# Patient Record
Sex: Female | Born: 1950 | Race: White | Marital: Married | Smoking: Never smoker
Health system: Southern US, Community
[De-identification: ages and names within clinical notes are randomized; demographics above are authoritative.]

## PROBLEM LIST (undated history)

## (undated) ENCOUNTER — Ambulatory Visit (INDEPENDENT_AMBULATORY_CARE_PROVIDER_SITE_OTHER): Admission: RE | Payer: Self-pay

## (undated) DIAGNOSIS — I10 Essential (primary) hypertension: Secondary | ICD-10-CM

## (undated) DIAGNOSIS — G902 Horner's syndrome: Secondary | ICD-10-CM

## (undated) DIAGNOSIS — M659 Synovitis and tenosynovitis, unspecified: Secondary | ICD-10-CM

## (undated) DIAGNOSIS — E785 Hyperlipidemia, unspecified: Secondary | ICD-10-CM

## (undated) DIAGNOSIS — F419 Anxiety disorder, unspecified: Secondary | ICD-10-CM

## (undated) HISTORY — DX: Essential (primary) hypertension: I10

## (undated) HISTORY — DX: Horner's syndrome: G90.2

## (undated) HISTORY — DX: Anxiety disorder, unspecified: F41.9

## (undated) HISTORY — DX: Synovitis and tenosynovitis, unspecified: M65.9

## (undated) HISTORY — DX: Hyperlipidemia, unspecified: E78.5

---

## 2000-02-19 ENCOUNTER — Emergency Department: Admit: 2000-02-19 | Payer: Self-pay | Source: Emergency Department | Admitting: Emergency Medicine

## 2007-01-26 ENCOUNTER — Ambulatory Visit
Admit: 2007-01-26 | Disposition: A | Discharge status: Home / Self Care | Payer: Self-pay | Source: Ambulatory Visit | Admitting: Orthopaedic Surgery

## 2009-03-20 ENCOUNTER — Emergency Department: Admit: 2009-03-20 | Payer: Self-pay | Source: Emergency Department | Admitting: Emergency Medical Services

## 2009-03-20 LAB — CK: Creatine Kinase (CK): 39 U/L (ref 20–140)

## 2009-03-20 LAB — CBC AND DIFFERENTIAL
Basophils Absolute: 0 /mm3 (ref 0.0–0.2)
Basophils: 0 % (ref 0–2)
Eosinophils Absolute: 0.1 /mm3 (ref 0.0–0.7)
Eosinophils: 2 % (ref 0–5)
Granulocytes Absolute: 4 /mm3 (ref 1.8–8.1)
Hematocrit: 37 % (ref 37.0–47.0)
Hgb: 12.6 G/DL (ref 12.0–16.0)
Immature Granulocytes Absolute: 0 CUMM (ref 0.0–0.0)
Immature Granulocytes: 0 % (ref 0–1)
Lymphocytes Absolute: 1.4 /mm3 (ref 0.5–4.4)
Lymphocytes: 25 % (ref 15–41)
MCH: 29.3 PG (ref 28.0–32.0)
MCHC: 34.1 G/DL (ref 32.0–36.0)
MCV: 86 FL (ref 80.0–100.0)
MPV: 10.6 FL (ref 9.4–12.3)
Monocytes Absolute: 0.4 /mm3 (ref 0.0–1.2)
Monocytes: 6 % (ref 0–11)
Neutrophils %: 67 % (ref 52–75)
Platelets: 269 /mm3 (ref 140–400)
RBC: 4.3 /mm3 (ref 4.20–5.40)
RDW: 12.6 % (ref 11.5–15.0)
WBC: 5.88 /mm3 (ref 3.50–10.80)

## 2009-03-20 LAB — BASIC METABOLIC PANEL
BUN: 9 MG/DL (ref 7–21)
CO2: 30 MEQ/L (ref 22–31)
Calcium: 9.8 MG/DL (ref 8.6–10.2)
Chloride: 101 MEQ/L (ref 98–107)
Creatinine: 0.7 MG/DL (ref 0.5–1.4)
Glucose: 96 MG/DL (ref 65–110)
Potassium: 4.1 MEQ/L (ref 3.6–5.0)
Sodium: 141 MEQ/L (ref 136–143)

## 2009-03-20 LAB — TROPONIN I QUANTITATIVE LEVEL - IFOH CERNER: Troponin I Quant: <0.01 NG/ML

## 2009-03-20 LAB — GFR

## 2009-04-22 ENCOUNTER — Ambulatory Visit
Admit: 2009-04-22 | Disposition: A | Discharge status: Home / Self Care | Payer: Self-pay | Source: Ambulatory Visit | Admitting: Family Medicine

## 2011-02-10 ENCOUNTER — Telehealth (INDEPENDENT_AMBULATORY_CARE_PROVIDER_SITE_OTHER): Payer: Self-pay

## 2011-02-10 NOTE — Telephone Encounter (Signed)
Message copied by Benedict Needy on Thu Feb 10, 2011  2:46 PM  ------       Message from: Sissy Hoff       Created: Thu Feb 10, 2011  2:26 PM       Regarding: Needs Rx refill       Contact: [phone]         Rx: Lexapro 5mg  call in at [facility] tel: [phone]

## 2011-02-10 NOTE — Telephone Encounter (Signed)
Lexapro 5 mg  # 30  With 1 refill faxed to [facility].

## 2011-02-25 ENCOUNTER — Other Ambulatory Visit (INDEPENDENT_AMBULATORY_CARE_PROVIDER_SITE_OTHER): Payer: Self-pay

## 2011-02-25 MED ORDER — MOMETASONE FUROATE 50 MCG/ACT NA SUSP
2.00 | Freq: Every day | NASAL | Status: DC
Start: 2011-02-25 — End: 2011-04-05

## 2011-02-25 MED ORDER — FAMOTIDINE 20 MG PO TABS
20.00 mg | ORAL_TABLET | Freq: Two times a day (BID) | ORAL | Status: DC
Start: 2011-02-25 — End: 2011-04-05

## 2011-03-14 ENCOUNTER — Telehealth (INDEPENDENT_AMBULATORY_CARE_PROVIDER_SITE_OTHER): Payer: Self-pay

## 2011-03-14 NOTE — Telephone Encounter (Signed)
PA phone # [phone] for nasonex is pending. Insurance co will fax back approval or denial.

## 2011-03-16 LAB — ECG-RIGHT SIDED
Atrial Rate: 104 {beats}/min
P Axis: 72 degrees
P-R Interval: 146 ms
Q-T Interval: 310 ms
QRS Duration: 72 ms
QTC Calculation (Bezet): 407 ms
R Axis: 38 degrees
T Axis: 63 degrees
Ventricular Rate: 104 {beats}/min

## 2011-04-05 ENCOUNTER — Encounter (INDEPENDENT_AMBULATORY_CARE_PROVIDER_SITE_OTHER): Payer: Self-pay | Admitting: Family Medicine

## 2011-04-05 ENCOUNTER — Ambulatory Visit (INDEPENDENT_AMBULATORY_CARE_PROVIDER_SITE_OTHER): Payer: PRIVATE HEALTH INSURANCE | Admitting: Family Medicine

## 2011-04-05 VITALS — BP 122/80 | HR 73 | Temp 96.8°F | Ht 60.0 in | Wt 119.6 lb

## 2011-04-05 DIAGNOSIS — M778 Other enthesopathies, not elsewhere classified: Secondary | ICD-10-CM

## 2011-04-05 DIAGNOSIS — F411 Generalized anxiety disorder: Secondary | ICD-10-CM

## 2011-04-05 DIAGNOSIS — J309 Allergic rhinitis, unspecified: Secondary | ICD-10-CM

## 2011-04-05 DIAGNOSIS — E559 Vitamin D deficiency, unspecified: Secondary | ICD-10-CM

## 2011-04-05 DIAGNOSIS — M65849 Other synovitis and tenosynovitis, unspecified hand: Secondary | ICD-10-CM

## 2011-04-05 DIAGNOSIS — Z0000 Encounter for general adult medical examination without abnormal findings: Secondary | ICD-10-CM

## 2011-04-05 DIAGNOSIS — M659 Synovitis and tenosynovitis, unspecified: Secondary | ICD-10-CM | POA: Insufficient documentation

## 2011-04-05 DIAGNOSIS — E785 Hyperlipidemia, unspecified: Secondary | ICD-10-CM

## 2011-04-05 DIAGNOSIS — F419 Anxiety disorder, unspecified: Secondary | ICD-10-CM

## 2011-04-05 DIAGNOSIS — Z9109 Other allergy status, other than to drugs and biological substances: Secondary | ICD-10-CM

## 2011-04-05 DIAGNOSIS — Z8719 Personal history of other diseases of the digestive system: Secondary | ICD-10-CM

## 2011-04-05 DIAGNOSIS — Z8669 Personal history of other diseases of the nervous system and sense organs: Secondary | ICD-10-CM

## 2011-04-05 MED ORDER — ESCITALOPRAM OXALATE 5 MG PO TABS
5.00 mg | ORAL_TABLET | Freq: Every day | ORAL | Status: DC
Start: 2011-04-05 — End: 2011-07-21

## 2011-04-05 MED ORDER — DICLOFENAC SODIUM 1 % TD GEL
Freq: Four times a day (QID) | TRANSDERMAL | Status: AC
Start: 2011-04-05 — End: ?

## 2011-04-05 MED ORDER — FAMOTIDINE 20 MG PO TABS
20.00 mg | ORAL_TABLET | Freq: Two times a day (BID) | ORAL | Status: DC
Start: 2011-04-05 — End: 2011-09-01

## 2011-04-05 MED ORDER — MOMETASONE FUROATE 50 MCG/ACT NA SUSP
2.00 | Freq: Every day | NASAL | Status: AC
Start: 2011-04-05 — End: 2012-04-04

## 2011-04-06 LAB — CBC AND DIFFERENTIAL
Baso(Absolute): 0.1 10*3/uL (ref 0.0–0.2)
Basos: 1 % (ref 0–3)
Eos: 2 % (ref 0–7)
Eosinophils Absolute: 0.1 10*3/uL (ref 0.0–0.4)
Hematocrit: 38.8 % (ref 34.0–46.6)
Hemoglobin: 12.4 g/dL (ref 11.1–15.9)
Immature Granulocytes Absolute: 0 10*3/uL (ref 0.0–0.1)
Immature Granulocytes: 0 % (ref 0–2)
Lymphocytes Absolute: 2.2 10*3/uL (ref 0.7–4.5)
Lymphocytes: 48 % — ABNORMAL HIGH (ref 14–46)
MCH: 29.5 pg (ref 26.6–33.0)
MCHC: 32 g/dL (ref 31.5–35.7)
MCV: 92 fL (ref 79–97)
Monocytes Absolute: 0.3 10*3/uL (ref 0.1–1.0)
Monocytes: 6 % (ref 4–13)
Neutrophils Absolute: 2 10*3/uL (ref 1.8–7.8)
Neutrophils: 43 % (ref 40–74)
Platelets: 252 10*3/uL (ref 140–415)
RBC: 4.2 x10E6/uL (ref 3.77–5.28)
RDW: 14 % (ref 12.3–15.4)
WBC: 4.7 10*3/uL (ref 4.0–10.5)

## 2011-04-06 LAB — LIPID PANEL
Cholesterol / HDL Ratio: 2.4 ratio units (ref 0.0–4.4)
Cholesterol: 213 mg/dL — ABNORMAL HIGH (ref 100–199)
HDL: 87 mg/dL (ref >39)
LDL Calculated: 114 mg/dL — ABNORMAL HIGH (ref 0–99)
Triglycerides: 61 mg/dL (ref 0–149)
VLDL Calculated: 12 mg/dL (ref 5–40)

## 2011-04-06 LAB — COMPREHENSIVE METABOLIC PANEL
ALT: 27 IU/L (ref 0–40)
AST (SGOT): 24 IU/L (ref 0–40)
Albumin/Globulin Ratio: 1.8 (ref 1.1–2.5)
Albumin: 4.6 g/dL (ref 3.5–5.5)
Alkaline Phosphatase: 91 IU/L (ref 25–150)
BUN / Creatinine Ratio: 23 (ref 9–23)
BUN: 16 mg/dL (ref 6–24)
Bilirubin, Total: 0.3 mg/dL (ref 0.0–1.2)
CO2: 24 mmol/L (ref 20–32)
Calcium: 9.5 mg/dL (ref 8.7–10.2)
Chloride: 101 mmol/L (ref 97–108)
Creatinine: 0.71 mg/dL (ref 0.57–1.00)
EGFR: 109 mL/min/{1.73_m2} (ref >59)
EGFR: 94 mL/min/{1.73_m2} (ref >59)
Globulin, Total: 2.5 g/dL (ref 1.5–4.5)
Glucose: 88 mg/dL (ref 65–99)
Potassium: 4.5 mmol/L (ref 3.5–5.2)
Protein, Total: 7.1 g/dL (ref 6.0–8.5)
Sodium: 139 mmol/L (ref 134–144)

## 2011-04-06 LAB — T4, FREE: T4, Free: 1.13 ng/dL (ref 0.82–1.77)

## 2011-04-06 LAB — TSH: TSH: 2.56 u[IU]/mL (ref 0.450–4.500)

## 2011-04-07 ENCOUNTER — Encounter (INDEPENDENT_AMBULATORY_CARE_PROVIDER_SITE_OTHER): Payer: Self-pay | Admitting: Family Medicine

## 2011-04-07 DIAGNOSIS — Z8669 Personal history of other diseases of the nervous system and sense organs: Secondary | ICD-10-CM | POA: Insufficient documentation

## 2011-04-07 DIAGNOSIS — F419 Anxiety disorder, unspecified: Secondary | ICD-10-CM | POA: Insufficient documentation

## 2011-04-07 NOTE — Progress Notes (Signed)
Subjective:       Patient ID: Kim Hendricks is a 60 y.o. female.    HPI  Feeling good. Still has same issues but less severe. 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 Saw neurology.  BP has been fine without meds-actually gets lightheaded if she gets up quickly-anxiety controlled, c/o sticking and some pain l thumb joint- no injury  Current outpatient prescriptions:escitalopram (LEXAPRO) 5 MG tablet, Take 1 tablet (5 mg total) by mouth daily., Disp: 90 tablet, Rfl: 2;  Diclofenac Sodium (VOLTAREN) 1 % GEL, Apply topically 4 (four) times daily., Disp: 1 Tube, Rfl: 2;  famotidine (PEPCID) 20 MG tablet, Take 1 tablet (20 mg total) by mouth 2 (two) times daily., Disp: 180 tablet, Rfl: 3  mometasone (NASONEX) 50 MCG/ACT nasal spray, 2 sprays by Nasal route daily., Disp: 17 g, Rfl: 2  Past Medical History   Diagnosis Date   . Anxiety    . Hypertension    . Hyperlipidemia    . Horner's syndrome      R eye pyosis-opth   . Flexor Tenosynovitis Of Thumb        The following portions of the patient's history were reviewed and updated as appropriate: allergies, current medications, past family history, past medical history, past social history, past surgical history and problem list.    Review of Systems   Constitutional: Positive for fatigue.   HENT: Negative.    Respiratory: Negative.    Cardiovascular: Negative.    Gastrointestinal: Negative.    Musculoskeletal: Positive for joint swelling.   Skin: Negative.    Neurological: Negative.    Psychiatric/Behavioral: The patient is nervous/anxious.          BP 122/80  Pulse 73  Temp(Src) 96.8 F (36 C) (Temporal Artery)  Ht 1.524 m (5')  Wt 54.25 kg (119 lb 9.6 oz)  BMI 23.36 kg/m2  SpO2 99%    Objective:    Physical Exam   Constitutional: She is oriented to person, place, and time. She appears well-developed.   HENT:   Head: Normocephalic and atraumatic.   Right Ear: External ear normal.   Left Ear: External ear normal.   Nose:  Nose normal.   Mouth/Throat: Oropharynx is clear and moist. No oropharyngeal exudate.   Eyes: Conjunctivae and EOM are normal. Pupils are equal, round, and reactive to light. Right eye exhibits no discharge. Left eye exhibits no discharge. No scleral icterus.        Mild R eye ptosis   Neck: Normal range of motion. Neck supple. No JVD present. No thyromegaly present.   Cardiovascular: Normal rate, regular rhythm, normal heart sounds and intact distal pulses.  Exam reveals no gallop and no friction rub.    No murmur heard.  Pulmonary/Chest: Effort normal and breath sounds normal. No respiratory distress. She has no wheezes. She has no rales. She exhibits no tenderness.   Abdominal: Soft. Bowel sounds are normal. She exhibits no distension and no mass. There is no tenderness. There is no rebound and no guarding.   Musculoskeletal: Normal range of motion. She exhibits no edema and no tenderness.        L thumb with mild tenderness at the base some mild sticking  With flexion   Lymphadenopathy:     She has no cervical adenopathy.   Neurological: She is alert and oriented to person, place, and time. She has normal reflexes. She displays normal reflexes. No cranial nerve deficit. She exhibits normal  muscle tone. Coordination normal.   Skin: Skin is warm and dry. No rash noted. No erythema. No pallor.   Psychiatric: She has a normal mood and affect. Her behavior is normal. Judgment and thought content normal.           Assessment:     well exam-improved      see orthopedics , wear thumb spicca splint as needed for thumb tendonitis  Plan:       Check labs, continue meds    xray l thumb

## 2011-04-09 LAB — VITAMIN D-25 HYDROXY (D2/D3/TOTAL)
25-Hydroxy D Total: 44 ng/mL
25-Hydroxy D2: 13 ng/mL
25-Hydroxy D3: 31 ng/mL

## 2011-04-21 ENCOUNTER — Telehealth (INDEPENDENT_AMBULATORY_CARE_PROVIDER_SITE_OTHER): Payer: Self-pay

## 2011-04-21 NOTE — Telephone Encounter (Signed)
Lm on home phone for pt to cb to notify of results.

## 2011-04-26 ENCOUNTER — Telehealth (INDEPENDENT_AMBULATORY_CARE_PROVIDER_SITE_OTHER): Payer: Self-pay

## 2011-04-26 NOTE — Telephone Encounter (Signed)
Patient is informed about her labs are all ok but repeat CBC 3 weeks -may have virus-not quite nornal-not fasting. Thanks

## 2011-04-28 ENCOUNTER — Ambulatory Visit (INDEPENDENT_AMBULATORY_CARE_PROVIDER_SITE_OTHER): Payer: PRIVATE HEALTH INSURANCE

## 2011-04-28 DIAGNOSIS — R7989 Other specified abnormal findings of blood chemistry: Secondary | ICD-10-CM

## 2011-04-28 NOTE — Progress Notes (Signed)
Pt in for blood work which was done by our [facility] lab tech

## 2011-04-29 ENCOUNTER — Telehealth (INDEPENDENT_AMBULATORY_CARE_PROVIDER_SITE_OTHER): Payer: Self-pay

## 2011-04-29 ENCOUNTER — Encounter (INDEPENDENT_AMBULATORY_CARE_PROVIDER_SITE_OTHER): Payer: Self-pay

## 2011-04-29 LAB — CBC AND DIFFERENTIAL
Baso(Absolute): 0 10*3/uL (ref 0.0–0.2)
Basos: 0 % (ref 0–3)
Eos: 2 % (ref 0–7)
Eosinophils Absolute: 0.1 10*3/uL (ref 0.0–0.4)
Hematocrit: 37 % (ref 34.0–46.6)
Hemoglobin: 12 g/dL (ref 11.1–15.9)
Immature Granulocytes Absolute: 0 10*3/uL (ref 0.0–0.1)
Immature Granulocytes: 0 % (ref 0–2)
Lymphocytes Absolute: 2.3 10*3/uL (ref 0.7–4.5)
Lymphocytes: 34 % (ref 14–46)
MCH: 30.2 pg (ref 26.6–33.0)
MCHC: 32.4 g/dL (ref 31.5–35.7)
MCV: 93 fL (ref 79–97)
Monocytes Absolute: 0.4 10*3/uL (ref 0.1–1.0)
Monocytes: 6 % (ref 4–13)
Neutrophils Absolute: 3.9 10*3/uL (ref 1.8–7.8)
Neutrophils: 58 % (ref 40–74)
Platelets: 241 10*3/uL (ref 140–415)
RBC: 3.98 x10E6/uL (ref 3.77–5.28)
RDW: 13.7 % (ref 12.3–15.4)
WBC: 6.7 10*3/uL (ref 4.0–10.5)

## 2011-04-29 NOTE — Telephone Encounter (Signed)
Pt notified of results:  1. CBC is back to normal  2. Thyroid u/s shows small cyst- not suspicious   3. Mammogram is ok- advised pt to f/u if any breast symptoms

## 2011-05-31 ENCOUNTER — Ambulatory Visit (INDEPENDENT_AMBULATORY_CARE_PROVIDER_SITE_OTHER): Payer: PRIVATE HEALTH INSURANCE | Admitting: Family Medicine

## 2011-05-31 ENCOUNTER — Encounter (INDEPENDENT_AMBULATORY_CARE_PROVIDER_SITE_OTHER): Payer: Self-pay | Admitting: Family Medicine

## 2011-05-31 VITALS — BP 110/78 | HR 73 | Temp 97.2°F | Ht 60.0 in | Wt 120.0 lb

## 2011-05-31 DIAGNOSIS — M719 Bursopathy, unspecified: Secondary | ICD-10-CM

## 2011-05-31 DIAGNOSIS — M25519 Pain in unspecified shoulder: Secondary | ICD-10-CM

## 2011-05-31 DIAGNOSIS — M7590 Shoulder lesion, unspecified, unspecified shoulder: Secondary | ICD-10-CM

## 2011-05-31 DIAGNOSIS — T50905A Adverse effect of unspecified drugs, medicaments and biological substances, initial encounter: Secondary | ICD-10-CM

## 2011-05-31 DIAGNOSIS — T887XXA Unspecified adverse effect of drug or medicament, initial encounter: Secondary | ICD-10-CM

## 2011-06-02 ENCOUNTER — Encounter (INDEPENDENT_AMBULATORY_CARE_PROVIDER_SITE_OTHER): Payer: Self-pay | Admitting: Family Medicine

## 2011-06-02 DIAGNOSIS — T50905A Adverse effect of unspecified drugs, medicaments and biological substances, initial encounter: Secondary | ICD-10-CM | POA: Insufficient documentation

## 2011-06-02 NOTE — Progress Notes (Signed)
Subjective:       Patient ID: Kim Hendricks is a 60 y.o. female.    HPI  60 yo wf here for follow up. C/O L arm pain-occ pain with movement especially abduction of arm . No injury, nothing happened. Been going on for several months  Past Medical History   Diagnosis Date   . Anxiety    . Hypertension    . Hyperlipidemia    . Horner's syndrome      R eye pyosis-opth   . Flexor Tenosynovitis Of Thumb    . Other complications of the administration of anesthesia or other sedation in labor and delivery, antepartum      autonomic dysfunction-resolved-6 months to resolve     Current outpatient prescriptions:Diclofenac Sodium (VOLTAREN) 1 % GEL, Apply topically 4 (four) times daily., Disp: 1 Tube, Rfl: 2;  escitalopram (LEXAPRO) 5 MG tablet, Take 1 tablet (5 mg total) by mouth daily., Disp: 90 tablet, Rfl: 2;  famotidine (PEPCID) 20 MG tablet, Take 1 tablet (20 mg total) by mouth 2 (two) times daily., Disp: 180 tablet, Rfl: 3  mometasone (NASONEX) 50 MCG/ACT nasal spray, 2 sprays by Nasal route daily., Disp: 17 g, Rfl: 2    The following portions of the patient's history were reviewed and updated as appropriate: allergies, current medications, past family history, past medical history, past social history, past surgical history and problem list.    Review of Systems   Constitutional: Negative.    HENT: Negative.    Respiratory: Negative.    Cardiovascular: Negative.    Gastrointestinal: Negative.    Musculoskeletal: Positive for arthralgias.   Skin: Negative.    Neurological: Negative.    Psychiatric/Behavioral: Negative.          BP 110/78  Pulse 73  Temp(Src) 97.2 F (36.2 C) (Temporal Artery)  Ht 1.524 m (5')  Wt 54.432 kg (120 lb)  BMI 23.44 kg/m2  SpO2 97%    Objective:    Physical Exam   Constitutional: She is oriented to person, place, and time. She appears well-developed and well-nourished. No distress.   HENT:   Head: Normocephalic and atraumatic.   Mouth/Throat: No oropharyngeal exudate.   Eyes:  Conjunctivae and EOM are normal. Pupils are equal, round, and reactive to light. Right eye exhibits no discharge. Left eye exhibits no discharge. No scleral icterus.   Neck: Normal range of motion. Neck supple. No JVD present. No thyromegaly present.   Cardiovascular: Normal rate and regular rhythm.    Pulmonary/Chest: Effort normal and breath sounds normal.   Musculoskeletal: She exhibits tenderness. She exhibits no edema.        Mild tenderness L supraspinatus tendon, no swselling   Lymphadenopathy:     She has no cervical adenopathy.   Neurological: She is alert and oriented to person, place, and time.   Skin: Skin is warm and dry. She is not diaphoretic.   Psychiatric: She has a normal mood and affect. Her behavior is normal. Judgment and thought content normal.           Assessment:     L shoulder pain/tendonitis   anxiety d/o      Plan:       xray. PT consult

## 2011-06-07 ENCOUNTER — Telehealth (INDEPENDENT_AMBULATORY_CARE_PROVIDER_SITE_OTHER): Payer: Self-pay

## 2011-06-07 NOTE — Telephone Encounter (Addendum)
Per PPN, lvm informing normal xray advised can do PT.

## 2011-06-08 ENCOUNTER — Encounter (INDEPENDENT_AMBULATORY_CARE_PROVIDER_SITE_OTHER): Payer: Self-pay

## 2011-07-21 ENCOUNTER — Encounter (INDEPENDENT_AMBULATORY_CARE_PROVIDER_SITE_OTHER): Payer: Self-pay | Admitting: Family Medicine

## 2011-07-21 ENCOUNTER — Other Ambulatory Visit (INDEPENDENT_AMBULATORY_CARE_PROVIDER_SITE_OTHER): Payer: Self-pay | Admitting: Family Medicine

## 2011-07-21 ENCOUNTER — Ambulatory Visit (INDEPENDENT_AMBULATORY_CARE_PROVIDER_SITE_OTHER): Payer: PRIVATE HEALTH INSURANCE | Admitting: Family Medicine

## 2011-07-21 VITALS — BP 100/66 | HR 75 | Temp 97.9°F | Resp 17 | Ht 60.0 in | Wt 122.0 lb

## 2011-07-21 DIAGNOSIS — K219 Gastro-esophageal reflux disease without esophagitis: Secondary | ICD-10-CM

## 2011-07-21 DIAGNOSIS — F411 Generalized anxiety disorder: Secondary | ICD-10-CM

## 2011-07-21 DIAGNOSIS — F419 Anxiety disorder, unspecified: Secondary | ICD-10-CM

## 2011-07-21 MED ORDER — ESCITALOPRAM OXALATE 5 MG PO TABS
ORAL_TABLET | ORAL | Status: DC
Start: 2011-07-21 — End: 2011-08-31

## 2011-07-21 MED ORDER — LORAZEPAM 0.5 MG PO TABS
0.50 mg | ORAL_TABLET | Freq: Four times a day (QID) | ORAL | Status: AC | PRN
Start: 2011-07-21 — End: 2011-07-31

## 2011-07-21 MED ORDER — ESOMEPRAZOLE MAGNESIUM 40 MG PO CPDR
40.00 mg | DELAYED_RELEASE_CAPSULE | Freq: Every day | ORAL | Status: DC
Start: 2011-07-21 — End: 2011-09-01

## 2011-07-21 NOTE — Progress Notes (Signed)
Subjective:       Patient ID: Kim Hendricks is a 61 y.o. female.    XB:[id number]    HPI Patient complains of epigastric pain for the past few weeks, she's tried Pepcid which did not work, but prevacid helped more, but the pain is still present, worse at night. She's had stomach problems for most of her life. She would also like to get on the brand name Lexapro, she does not feel the generic is working.    The following portions of the patient's history were reviewed and updated as appropriate: allergies, current medications, past family history, past medical history, past social history, past surgical history and problem list.    Current Outpatient Prescriptions on File Prior to Visit   Medication Sig Dispense Refill   . Diclofenac Sodium (VOLTAREN) 1 % GEL Apply topically 4 (four) times daily.  1 Tube  2   . famotidine (PEPCID) 20 MG tablet Take 1 tablet (20 mg total) by mouth 2 (two) times daily.  180 tablet  3   . mometasone (NASONEX) 50 MCG/ACT nasal spray 2 sprays by Nasal route daily.  17 g  2   . DISCONTD: escitalopram (LEXAPRO) 5 MG tablet Take 1 tablet (5 mg total) by mouth daily.  90 tablet  2      No Known Allergies     Review of Systems   Constitutional: Negative.    HENT: Negative.    Respiratory: Negative.    Cardiovascular: Negative.    Gastrointestinal: Positive for abdominal pain and abdominal distention. Negative for nausea and vomiting.           Filed Vitals:    07/21/11 1108   BP: 100/66   Pulse: 75   Temp: 97.9 F (36.6 C)   TempSrc: Tympanic   Resp: 17   Height: 1.524 m (5')   Weight: 55.339 kg (122 lb)   SpO2: 97%       Objective:    Physical Exam   Constitutional: She appears well-developed and well-nourished.   HENT:   Right Ear: External ear normal.   Left Ear: External ear normal.   Nose: Nose normal.   Mouth/Throat: Oropharynx is clear and moist.   Eyes: Conjunctivae and EOM are normal. Pupils are equal, round, and reactive to light.   Neck: Neck supple.   Cardiovascular: Normal rate and  regular rhythm.    Pulmonary/Chest: Effort normal and breath sounds normal.   Abdominal: Soft. Bowel sounds are normal. She exhibits no distension and no mass. There is no tenderness. There is no rebound and no guarding.           Assessment:       GERD  anxiety      Plan:       Labs-H.Pylori breath test  Nexium  Refill Lexapro with Brand name

## 2011-07-22 LAB — H. PYLORI BREATH TEST: H. pylori Breath Test: NEGATIVE

## 2011-07-27 ENCOUNTER — Encounter (INDEPENDENT_AMBULATORY_CARE_PROVIDER_SITE_OTHER): Payer: Self-pay | Admitting: Family Medicine

## 2011-08-04 ENCOUNTER — Telehealth (INDEPENDENT_AMBULATORY_CARE_PROVIDER_SITE_OTHER): Payer: Self-pay

## 2011-08-04 NOTE — Telephone Encounter (Signed)
Spoke with pt regarding rx coverage- attempted to call [employer] for prior authorization of Nexium, unable to identify pt.  Pt verified member id- however when calling [employer] you must leave off the first 3 digits of id number in order for them to pull up pt in the system.

## 2011-08-24 ENCOUNTER — Ambulatory Visit (INDEPENDENT_AMBULATORY_CARE_PROVIDER_SITE_OTHER): Payer: PRIVATE HEALTH INSURANCE | Admitting: Family Medicine

## 2011-08-24 ENCOUNTER — Encounter (INDEPENDENT_AMBULATORY_CARE_PROVIDER_SITE_OTHER): Payer: Self-pay | Admitting: Family Medicine

## 2011-08-24 VITALS — BP 130/88 | HR 81 | Temp 98.0°F | Resp 18 | Ht <= 58 in | Wt 121.8 lb

## 2011-08-24 DIAGNOSIS — K219 Gastro-esophageal reflux disease without esophagitis: Secondary | ICD-10-CM

## 2011-08-24 MED ORDER — LANSOPRAZOLE 30 MG PO CPDR
30.00 mg | DELAYED_RELEASE_CAPSULE | Freq: Every day | ORAL | Status: AC
Start: 2011-08-24 — End: 2012-08-23

## 2011-08-24 NOTE — Progress Notes (Signed)
Subjective:       Patient ID: Kim Hendricks is a 61 y.o. female.    ZO:XWRUEAV pain    HPI Patient complains of epigastric pain, was worse last night, she vomited in the middle of the night. She was taking Maalox, Mylanta and Pepcid. She's waiting on a prior auth for Nexium    The following portions of the patient's history were reviewed and updated as appropriate: allergies, current medications, past family history, past medical history, past social history, past surgical history and problem list.    Past Medical History   Diagnosis Date   . Anxiety    . Hypertension    . Hyperlipidemia    . Horner's syndrome      R eye pyosis-opth   . Flexor Tenosynovitis Of Thumb    . Other complications of the administration of anesthesia or other sedation in labor and delivery, antepartum      autonomic dysfunction-resolved-6 months to resolve      Current Outpatient Prescriptions on File Prior to Visit   Medication Sig Dispense Refill   . Diclofenac Sodium (VOLTAREN) 1 % GEL Apply topically 4 (four) times daily.  1 Tube  2   . escitalopram (LEXAPRO) 5 MG tablet One po qd (Brand name medically necessary)  30 tablet  5   . famotidine (PEPCID) 20 MG tablet Take 1 tablet (20 mg total) by mouth 2 (two) times daily.  180 tablet  3   . mometasone (NASONEX) 50 MCG/ACT nasal spray 2 sprays by Nasal route daily.  17 g  2   . esomeprazole (NEXIUM) 40 MG capsule Take 1 capsule (40 mg total) by mouth daily.  30 capsule  1        No Known Allergies     Review of Systems   Constitutional: Negative.    HENT: Negative.    Respiratory: Negative.    Cardiovascular: Negative.    Gastrointestinal: Positive for vomiting and abdominal pain. Negative for nausea, diarrhea, constipation and abdominal distention.           Filed Vitals:    08/24/11 1039   BP: 130/88   Pulse: 81   Temp: 98 F (36.7 C)   TempSrc: Tympanic   Resp: 18   Height: 1.473 m (4\' 10" )   Weight: 55.248 kg (121 lb 12.8 oz)   SpO2: 98%       Objective:    Physical Exam    Constitutional: She appears well-developed and well-nourished.   HENT:   Right Ear: External ear normal.   Left Ear: External ear normal.   Nose: Nose normal.   Mouth/Throat: Oropharynx is clear and moist.   Eyes: Conjunctivae and EOM are normal. Pupils are equal, round, and reactive to light.   Neck: Neck supple.   Cardiovascular: Normal rate and regular rhythm.  Exam reveals no gallop and no friction rub.    No murmur heard.  Pulmonary/Chest: Effort normal and breath sounds normal. She has no wheezes. She has no rales.   Abdominal: Soft. Bowel sounds are normal. She exhibits no distension. There is tenderness. There is no rebound and no guarding.           Assessment:       Gerd      Plan:       Patient will take Prevacid 30mg  until prior auth comes through.  Followup if symptoms don't improve in 2-3days or worsen

## 2011-08-30 ENCOUNTER — Telehealth (INDEPENDENT_AMBULATORY_CARE_PROVIDER_SITE_OTHER): Payer: Self-pay

## 2011-08-30 NOTE — Telephone Encounter (Signed)
Patient has an appt with Dr.Nisha Chand (GI) today at 2:30pm

## 2011-08-30 NOTE — Telephone Encounter (Signed)
She has started taking the nexium. It doesn't make her feel to good when she takes it. It makes it worse. She is having trouble swallowing. She would like to know what she can do.

## 2011-08-31 ENCOUNTER — Other Ambulatory Visit (INDEPENDENT_AMBULATORY_CARE_PROVIDER_SITE_OTHER): Payer: Self-pay | Admitting: Family Medicine

## 2011-08-31 ENCOUNTER — Telehealth (INDEPENDENT_AMBULATORY_CARE_PROVIDER_SITE_OTHER): Payer: Self-pay | Admitting: Family Medicine

## 2011-08-31 MED ORDER — PAROXETINE HCL ER 12.5 MG PO TB24
12.50 mg | ORAL_TABLET | Freq: Every day | ORAL | Status: AC
Start: 2011-08-31 — End: 2012-08-30

## 2011-08-31 NOTE — Telephone Encounter (Signed)
Spoke with patient, will wean off Lexapro, will do PaxilCR 12.5mg , sent to [facility]  She went to GI Dr.Chand, she was put on Prevacid twice a day .

## 2011-08-31 NOTE — Telephone Encounter (Signed)
Message copied by Lerry Liner on Wed Aug 31, 2011  9:59 AM  ------       Message from: Duffy Rhody       Created: Wed Aug 31, 2011  8:35 AM       Contact: [phone]         Patient is having reaction to prevacid and needs to discuss med change.

## 2011-09-01 ENCOUNTER — Ambulatory Visit (INDEPENDENT_AMBULATORY_CARE_PROVIDER_SITE_OTHER): Payer: PRIVATE HEALTH INSURANCE | Admitting: Family Medicine

## 2011-09-01 ENCOUNTER — Encounter (INDEPENDENT_AMBULATORY_CARE_PROVIDER_SITE_OTHER): Payer: Self-pay | Admitting: Family Medicine

## 2011-09-01 VITALS — BP 140/90 | HR 89 | Temp 98.5°F | Resp 18 | Ht <= 58 in | Wt 121.0 lb

## 2011-09-01 DIAGNOSIS — K219 Gastro-esophageal reflux disease without esophagitis: Secondary | ICD-10-CM

## 2011-09-01 DIAGNOSIS — G909 Disorder of the autonomic nervous system, unspecified: Secondary | ICD-10-CM

## 2011-09-01 DIAGNOSIS — G901 Familial dysautonomia [Riley-Day]: Secondary | ICD-10-CM

## 2011-09-01 MED ORDER — CLONAZEPAM 1 MG PO TABS
1.00 mg | ORAL_TABLET | Freq: Two times a day (BID) | ORAL | Status: AC | PRN
Start: 2011-09-01 — End: 2011-10-01

## 2011-09-01 NOTE — Progress Notes (Signed)
Subjective:       Patient ID: Kim Hendricks is a 61 y.o. female.    ZO:XWRUEAVWU and reflux    HPI Patient here to followup, she saw the GI who initially doubled her Prevacid, but the patient thought she was having a drug interaction, so she was switched to Aciphex. She's having dysautonomia attacks, but not as severe as in 2010. She started taking the PaxilCR and would like Klonopin to help with her symptoms until the PaxilCR kicks in.    The following portions of the patient's history were reviewed and updated as appropriate: allergies, current medications, past family history, past medical history, past social history, past surgical history and problem list.    .  Past Medical History   Diagnosis Date   . Anxiety    . Hypertension    . Hyperlipidemia    . Horner's syndrome      R eye pyosis-opth   . Flexor Tenosynovitis Of Thumb    . Other complications of the administration of anesthesia or other sedation in labor and delivery, antepartum      autonomic dysfunction-resolved-6 months to resolve      Current Outpatient Prescriptions on File Prior to Visit   Medication Sig Dispense Refill   . Diclofenac Sodium (VOLTAREN) 1 % GEL Apply topically 4 (four) times daily.  1 Tube  2   . lansoprazole (PREVACID) 30 MG capsule Take 1 capsule (30 mg total) by mouth daily.  30 capsule  12   . mometasone (NASONEX) 50 MCG/ACT nasal spray 2 sprays by Nasal route daily.  17 g  2   . PARoxetine (PAXIL CR) 12.5 MG 24 hr tablet Take 1 tablet (12.5 mg total) by mouth daily.  30 tablet  2   . DISCONTD: esomeprazole (NEXIUM) 40 MG capsule Take 1 capsule (40 mg total) by mouth daily.  30 capsule  1   . DISCONTD: famotidine (PEPCID) 20 MG tablet Take 1 tablet (20 mg total) by mouth 2 (two) times daily.  180 tablet  3        No Known Allergies     Review of Systems   Constitutional: Negative.    HENT: Negative.    Respiratory: Negative.    Cardiovascular: Negative.    Gastrointestinal: Positive for abdominal pain.   Neurological:  Positive for dizziness.   Psychiatric/Behavioral: The patient is nervous/anxious.            Filed Vitals:    09/01/11 1300   BP: 140/90   Pulse: 89   Temp: 98.5 F (36.9 C)   TempSrc: Tympanic   Resp: 18   Height: 1.467 m (4' 9.75")   Weight: 54.885 kg (121 lb)   SpO2: 98%       Objective:    Physical Exam   Constitutional: She is oriented to person, place, and time. She appears well-developed and well-nourished.   HENT:   Right Ear: External ear normal.   Left Ear: External ear normal.   Nose: Nose normal.   Mouth/Throat: Oropharynx is clear and moist.   Eyes: Conjunctivae and EOM are normal. Pupils are equal, round, and reactive to light.   Neck: Neck supple.   Cardiovascular: Normal rate and regular rhythm.  Exam reveals no gallop.    No murmur heard.  Pulmonary/Chest: Effort normal and breath sounds normal. She has no wheezes. She has no rales.   Neurological: She is alert and oriented to person, place, and time. She has normal reflexes. She displays  normal reflexes.           Assessment:       Dysautonomia  GERD      Plan:       Will give patient Klonopin to help until Paxil CR becomes effective  Continue Aciphex, GI will do endoscopy when she returns from her cruise  Followup in one month

## 2011-09-02 ENCOUNTER — Encounter (INDEPENDENT_AMBULATORY_CARE_PROVIDER_SITE_OTHER): Payer: Self-pay

## 2011-09-06 ENCOUNTER — Encounter (INDEPENDENT_AMBULATORY_CARE_PROVIDER_SITE_OTHER): Payer: Self-pay | Admitting: Family Medicine

## 2011-09-06 ENCOUNTER — Ambulatory Visit (INDEPENDENT_AMBULATORY_CARE_PROVIDER_SITE_OTHER): Payer: PRIVATE HEALTH INSURANCE | Admitting: Family Medicine

## 2011-09-06 VITALS — BP 140/84 | HR 82 | Temp 97.2°F | Resp 18 | Ht 60.0 in | Wt 121.0 lb

## 2011-09-06 DIAGNOSIS — G901 Familial dysautonomia [Riley-Day]: Secondary | ICD-10-CM

## 2011-09-06 DIAGNOSIS — G909 Disorder of the autonomic nervous system, unspecified: Secondary | ICD-10-CM

## 2011-09-06 MED ORDER — METOPROLOL SUCCINATE ER 25 MG PO TB24
25.00 mg | ORAL_TABLET | Freq: Every day | ORAL | Status: AC
Start: 2011-09-06 — End: 2012-09-05

## 2011-09-06 NOTE — Progress Notes (Signed)
Subjective:       Patient ID: Kim Hendricks is a 61 y.o. female.    SW:FUXNA pressure    HPI Patient here to followup her blood pressure, her dysautonomia has been bothering her everyday. She started on the PaxilCR, she was suppose to go on a cruise but does not feel up to going. She was wondering if there was a dysautonomia clinic around here. She feels like she's going through panic attacks but they last several hours. She had a problem with this three years ago and it took six months to go away. At the time she was trying to control her symptoms with PaxilCR and ToprolXL    The following portions of the patient's history were reviewed and updated as appropriate: allergies, current medications, past family history, past medical history, past social history, past surgical history and problem list.    Past Medical History   Diagnosis Date   . Anxiety    . Hypertension    . Hyperlipidemia    . Horner's syndrome      R eye pyosis-opth   . Flexor Tenosynovitis Of Thumb    . Other complications of the administration of anesthesia or other sedation in labor and delivery, antepartum      autonomic dysfunction-resolved-6 months to resolve      Current Outpatient Prescriptions on File Prior to Visit   Medication Sig Dispense Refill   . clonAZEpam (KLONOPIN) 1 MG tablet Take 1 tablet (1 mg total) by mouth 2 (two) times daily as needed for Anxiety.  60 tablet  0   . Diclofenac Sodium (VOLTAREN) 1 % GEL Apply topically 4 (four) times daily.  1 Tube  2   . lansoprazole (PREVACID) 30 MG capsule Take 1 capsule (30 mg total) by mouth daily.  30 capsule  12   . mometasone (NASONEX) 50 MCG/ACT nasal spray 2 sprays by Nasal route daily.  17 g  2   . PARoxetine (PAXIL CR) 12.5 MG 24 hr tablet Take 1 tablet (12.5 mg total) by mouth daily.  30 tablet  2   . rabeprazole (ACIPHEX) 20 MG tablet Take 20 mg by mouth daily.          No Known Allergies     Review of Systems   Constitutional: Positive for fatigue.   HENT: Negative.     Respiratory: Negative.  Negative for shortness of breath.    Cardiovascular: Negative.  Negative for chest pain.   Gastrointestinal: Negative.    Psychiatric/Behavioral: The patient is nervous/anxious.            Filed Vitals:    09/06/11 0930   BP: 140/84   Pulse: 82   Temp: 97.2 F (36.2 C)   TempSrc: Tympanic   Resp: 18   Height: 1.524 m (5')   Weight: 54.885 kg (121 lb)   SpO2: 97%       Objective:    Physical Exam   Constitutional: She is oriented to person, place, and time. She appears well-developed and well-nourished.   HENT:   Right Ear: External ear normal.   Left Ear: External ear normal.   Nose: Nose normal.   Mouth/Throat: Oropharynx is clear and moist.   Eyes: Conjunctivae and EOM are normal. Pupils are equal, round, and reactive to light.   Neck: Neck supple.   Cardiovascular: Normal rate and regular rhythm.    Pulmonary/Chest: Effort normal and breath sounds normal. She has no wheezes. She has no rales.   Abdominal:  There is no tenderness. There is no rebound and no guarding.   Neurological: She is alert and oriented to person, place, and time. She has normal reflexes. No cranial nerve deficit. Coordination normal.           Assessment:       Dysautonomia-unknown cause, workup in the past was negative. Will start ToprolXL again, will try to find a dysautonomia clinic for her      Plan:       Followup in two weeks

## 2011-09-18 ENCOUNTER — Emergency Department: Payer: PRIVATE HEALTH INSURANCE

## 2011-09-18 ENCOUNTER — Emergency Department
Admission: EM | Admit: 2011-09-18 | Discharge: 2011-09-18 | Disposition: A | Discharge status: Home / Self Care | Payer: PRIVATE HEALTH INSURANCE | Attending: Emergency Medicine | Admitting: Emergency Medicine

## 2011-09-18 DIAGNOSIS — G43909 Migraine, unspecified, not intractable, without status migrainosus: Secondary | ICD-10-CM

## 2011-09-18 DIAGNOSIS — F411 Generalized anxiety disorder: Secondary | ICD-10-CM | POA: Insufficient documentation

## 2011-09-18 DIAGNOSIS — E785 Hyperlipidemia, unspecified: Secondary | ICD-10-CM | POA: Insufficient documentation

## 2011-09-18 DIAGNOSIS — I10 Essential (primary) hypertension: Secondary | ICD-10-CM | POA: Insufficient documentation

## 2011-09-18 MED ORDER — HYDROCODONE-ACETAMINOPHEN 5-325 MG PO TABS
2.00 | ORAL_TABLET | Freq: Once | ORAL | Status: AC
Start: 2011-09-18 — End: 2011-09-18
  Administered 2011-09-18: 2 via ORAL
  Filled 2011-09-18: qty 2

## 2011-09-18 MED ORDER — SUMATRIPTAN SUCCINATE 6 MG/0.5ML SC SOLN
6.00 mg | Freq: Once | SUBCUTANEOUS | Status: DC
Start: 2011-09-18 — End: 2011-09-18
  Filled 2011-09-18: qty 0.5

## 2011-09-18 NOTE — ED Notes (Signed)
\  [id number]\

## 2011-09-18 NOTE — Discharge Instructions (Signed)
Thank you for choosing [facility] for your emergency care needs.  We strive to provide EXCELLENT care to you and your family.      If you do not continue to improve or your condition worsens, please contact your doctor or return immediately to the Emergency Department.    YOUR CONTACT INFORMATION  Before leaving please check with registration to make sure we have an up-to-date contact number.  You can call registration at [phone] to update your information.  For questions about your hospital bill, please call [phone].  For questions about your Emergency Dept Physician bill please call [phone].      FREE HEALTH SERVICES  If you need help with health or social services, please call 2-1-1 for a free referral to resources in your area.  2-1-1 is a free service connecting people with information on health insurance, free clinics, pregnancy, mental health, dental care, food assistance, housing, and substance abuse counseling.  Also, available online at:  [email or web address]    DOCTOR REFERRALS  Call [phone] if you need any further referrals and we can help you find a primary care doctor or specialist.  Also, available online at:  [email or web address]    MEDICAL RECORDS AND TESTS  Certain laboratory test results do not come back the same day, for example urine cultures.   We will contact you if other important findings are noted.  Radiology films are often reviewed again to ensure accuracy.  If there is any discrepancy, we will notify you.      Please call [phone] to pick up a complimentary CD of any radiology studies performed.  If you or your doctor would like to request a copy of your medical records, please call [phone].      ORTHOPEDIC INJURY   Please know that significant injuries can exist even when an initial x-ray is read as normal or negative.  This can occur because some fractures (broken bones) are not initially visible on x-rays.   For this reason, close outpatient follow-up with your primary care doctor or bone specialist (orthopedist) is required.    MEDICATIONS AND FOLLOWUP  Please be aware that some prescription medications can cause drowsiness.  Use caution when driving or operating machinery.    The examination and treatment you have received in our Emergency Department is provided on an emergency basis, and is not intended to be a substitute for your primary care physician.  It is important that your doctor checks you again and that you report any new or remaining problems at that time.

## 2011-09-18 NOTE — ED Notes (Signed)
Patient has migraine headache that she attributes to autoimmune disorder. Nausea but no vomiting. Patient alert and responsive. Photophobia.

## 2011-09-18 NOTE — ED Notes (Signed)
Bed:B26<BR> Expected date:<BR> Expected time:<BR> Means of arrival:[id number] [place]<BR> Comments:<BR>

## 2011-09-18 NOTE — ED Notes (Signed)
Pt left with familywith dispo instructions and take home pain meds. Information exchanged

## 2011-09-18 NOTE — ED Provider Notes (Signed)
Physician/Midlevel provider first contact with patient: 2011    History     Chief Complaint   Patient presents with   . Migraine     History provided by: Patient  Kim Hendricks is a 61 y.o. female h/o HTN, high chol, Horner's syndrome presents via EMS for persistent sharp frontal and temporal migraine that began at 15:00 today, currently improved in ED. Associated with photophobia and nausea - now resolved after Zofran ODT. States migraine feels similar in characteristic to previous HA many years ago. Pt also reports epigastric pain which has been chronic for the past 2 months. No vision changes, neck pain, back pain, diarrhea, or urinary sxs.    PMD: Dr. Durene Cal        Past Medical History   Diagnosis Date   . Anxiety    . Hypertension    . Hyperlipidemia    . Horner's syndrome      R eye pyosis-opth   . Flexor Tenosynovitis Of Thumb    . Other complications of the administration of anesthesia or other sedation in labor and delivery, antepartum      autonomic dysfunction-resolved-6 months to resolve       No past surgical history on file.    Family History   Problem Relation Age of Onset   . COPD Mother    . Hypertension Father    . Cancer Maternal Grandfather      leukemia   . Hypertension Paternal Grandmother    . Hyperlipidemia Paternal Grandmother    . Heart disease Paternal Grandfather        Social  History   Substance Use Topics   . Smoking status: Never Smoker    . Smokeless tobacco: Never Used   . Alcohol Use: 0.0 oz/week       No Known Allergies    Current/Home Medications    CLONAZEPAM (KLONOPIN) 1 MG TABLET    Take 1 tablet (1 mg total) by mouth 2 (two) times daily as needed for Anxiety.    DICLOFENAC SODIUM (VOLTAREN) 1 % GEL    Apply topically 4 (four) times daily.    LANSOPRAZOLE (PREVACID) 30 MG CAPSULE    Take 1 capsule (30 mg total) by mouth daily.    METOPROLOL XL (TOPROL XL) 25 MG 24 HR TABLET    Take 1 tablet (25 mg total) by mouth daily.    MOMETASONE (NASONEX) 50 MCG/ACT NASAL SPRAY    2  sprays by Nasal route daily.    PAROXETINE (PAXIL CR) 12.5 MG 24 HR TABLET    Take 1 tablet (12.5 mg total) by mouth daily.    RABEPRAZOLE (ACIPHEX) 20 MG TABLET    Take 20 mg by mouth daily.        Review of Systems   Constitutional: Negative for fever.   HENT: Negative for ear pain and sore throat.    Eyes: Positive for photophobia.        Negative for vision changes.   Respiratory: Negative for shortness of breath.    Cardiovascular: Negative for chest pain.   Gastrointestinal: Positive for nausea and abdominal pain. Negative for diarrhea.   Genitourinary: Negative for dysuria and difficulty urinating.   Musculoskeletal: Negative for back pain.        Negative for neck pain.   Neurological: Positive for headaches.   All other systems reviewed and are negative.        Physical Exam    BP 126/71  Pulse 73  Temp(Src)  98.7 F (37.1 C) (Temporal Artery)  Resp 18  SpO2 96%    Physical Exam  CONSTITUTIONAL   Patient is afebrile, Vital signs reviewed, Normal pulse  Normal blood pressure, Normal respiratory rate, Well appearing, Pt appears uncomfortable  Alert and oriented X 3.  HEAD  Atraumatic, Normocephallc.  EYES   Eyes are normal to inspection, PERRL, No discharge from eyes, Normal sclera, Normal conjunctiva.  ENT  Ear examination normal, Posterior pharynx normal, Mouth normal to inspection.   NECK  No meningeal signs, Cervical spine nontender  RESPIRATORY CHEST  Chest is nontender, Breath sounds normal, No respiratory distress  CARDIOVASCULAR   RRR, No murmurs.  ABDOMEN   Mild epigastric tenderness, No masses, Bowel sounds normal, No distension, No peritoneal signs  BACK   There is no CVA tenderness, There is no tenderness to palpation, Normal inspection.  UPPER EXTREMITY   Inspection normal.  LOWER EXTREMITY   Inspection normal, No edema.  SKIN   Skin is warm, Skin is dry, Skin is normal color.  LYMPHATIC   No adenopathy in neck.  PSYCHIATRIC   Normal affect, Normal insight, Normal concentration.  NEURO   GCS  is 15, No focal motor deficits, No focal sensory deficits, Cranial nerves intact, No cerebellar deficits. Speech normal, Gait normal, Memory normal.           MDM and ED Course     ED Medication Orders      Start     Status Ordering Provider    09/18/11 2027   SUMAtriptan Succinate (IMITREX) injection 6 mg   Once      Route: Subcutaneous  Ordered Dose: 6 mg         Last MAR action:  Not Given Melida Gimenez                 MDM      Procedures    Clinical Impression & Disposition     Clinical Impression  Final diagnoses:   Migraine headache        ED Disposition     Discharge [name] discharge to home/self care.    Condition at discharge: Stable             New Prescriptions    No medications on file        Treatment Team: Scribe: Emeline General      Data Review     Nursing records reviewed and agree: Yes      I, Melida Gimenez, MD, personally performed the services documented.  Kim Hendricks is scribing for me on Kim Hendricks,Kim Hendricks. I reviewed and confirm the accuracy of the information in this medical record.     I, Emeline General, am serving as a Neurosurgeon to document services personally performed by Melida Gimenez, MD based on the provider's statements to me.     Rendering Provider: Melida Gimenez, MD    Signout     Patient signed out to:      Signout notes:        Monitors, EKG     Cardiac Monitor (interpreted by ED physician):      EKG (interpreted by ED physician):       Critical Care     Critical care exclusive of time spent performing procedures.    Total time:        Clinical Course / MDM     Working Differential (not completely inclusive):     Notes:   21:28 - Re-eval - Pt states  her migraine got better prior to medications. Therefore she is ready to go home without receiving any medications here in ER.      Discussion of abnormal results/incidental findings:           Melida Gimenez, MD  09/19/11 [phone]

## 2011-09-20 ENCOUNTER — Other Ambulatory Visit: Payer: Self-pay

## 2011-09-20 ENCOUNTER — Ambulatory Visit (INDEPENDENT_AMBULATORY_CARE_PROVIDER_SITE_OTHER): Payer: PRIVATE HEALTH INSURANCE | Admitting: Family Medicine

## 2011-09-20 ENCOUNTER — Encounter (INDEPENDENT_AMBULATORY_CARE_PROVIDER_SITE_OTHER): Payer: Self-pay | Admitting: Family Medicine

## 2011-09-20 VITALS — BP 140/82 | HR 84 | Temp 97.9°F | Resp 17 | Ht 60.0 in | Wt 121.0 lb

## 2011-09-20 DIAGNOSIS — I10 Essential (primary) hypertension: Secondary | ICD-10-CM

## 2011-09-20 DIAGNOSIS — G43909 Migraine, unspecified, not intractable, without status migrainosus: Secondary | ICD-10-CM

## 2011-09-20 DIAGNOSIS — R1011 Right upper quadrant pain: Secondary | ICD-10-CM

## 2011-09-20 MED ORDER — HYDROCODONE-ACETAMINOPHEN 5-500 MG PO TABS
1.00 | ORAL_TABLET | Freq: Four times a day (QID) | ORAL | Status: AC | PRN
Start: 2011-09-20 — End: 2012-09-19

## 2011-09-20 NOTE — Progress Notes (Signed)
Subjective:       Patient ID: Kim Hendricks is a 61 y.o. female.    [id number] ER visit    HPI Patient here to followup ER visit for migraines. She's not had a migraine in 30 years,she thinks the Aciphex may have caused her migraine, so she stopped taking it. She was prescribed Vicodin in the ER.  She also stopped taking her ToprolXL because her blood pressure was going to low and making her dizzy.  No chest pain, no shortness of breath. Patient states her blood pressure has been good at home.    Patient complains of epigastric pain and sometimes right upper pain. She has seen GI who wants to do an endoscopy, but patient is holding off because of problems with anesthesia in the past.    The following portions of the patient's history were reviewed and updated as appropriate: allergies, current medications, past family history, past medical history, past social history, past surgical history and problem list.    Past Medical History   Diagnosis Date   . Anxiety    . Hypertension    . Hyperlipidemia    . Horner's syndrome      R eye pyosis-opth   . Flexor Tenosynovitis Of Thumb    . Other complications of the administration of anesthesia or other sedation in labor and delivery, antepartum      autonomic dysfunction-resolved-6 months to resolve      Current Outpatient Prescriptions on File Prior to Visit   Medication Sig Dispense Refill   . clonAZEpam (KLONOPIN) 1 MG tablet Take 1 tablet (1 mg total) by mouth 2 (two) times daily as needed for Anxiety.  60 tablet  0   . Diclofenac Sodium (VOLTAREN) 1 % GEL Apply topically 4 (four) times daily.  1 Tube  2   . mometasone (NASONEX) 50 MCG/ACT nasal spray 2 sprays by Nasal route daily.  17 g  2   . PARoxetine (PAXIL CR) 12.5 MG 24 hr tablet Take 1 tablet (12.5 mg total) by mouth daily.  30 tablet  2   . rabeprazole (ACIPHEX) 20 MG tablet Take 20 mg by mouth daily.       . lansoprazole (PREVACID) 30 MG capsule Take 1 capsule (30 mg total) by mouth daily.  30 capsule  12    . metoprolol XL (TOPROL XL) 25 MG 24 hr tablet Take 1 tablet (25 mg total) by mouth daily.  30 tablet  5      No Known Allergies     Review of Systems   Constitutional: Positive for fatigue.   Eyes: Negative for visual disturbance.   Respiratory: Negative.  Negative for shortness of breath.    Cardiovascular: Negative.  Negative for chest pain.   Gastrointestinal: Positive for abdominal pain. Negative for nausea, vomiting, diarrhea and constipation.   Neurological: Positive for headaches.           Filed Vitals:    09/20/11 0903   BP: 140/82   Pulse: 84   Temp: 97.9 F (36.6 C)   TempSrc: Tympanic   Resp: 17   Height: 1.524 m (5')   Weight: 54.885 kg (121 lb)   SpO2: 98%       Objective:    Physical Exam   Constitutional: She appears well-developed and well-nourished.   HENT:   Right Ear: External ear normal.   Left Ear: External ear normal.   Nose: Nose normal.   Mouth/Throat: Oropharynx is clear and moist.  Eyes: Conjunctivae and EOM are normal. Pupils are equal, round, and reactive to light.   Neck: Neck supple.   Cardiovascular: Normal rate, regular rhythm and normal heart sounds.  Exam reveals no gallop and no friction rub.    No murmur heard.  Pulmonary/Chest: Effort normal and breath sounds normal. She has no wheezes. She has no rales.   Abdominal: Soft. Bowel sounds are normal. She exhibits no distension and no mass. There is no tenderness. There is no rebound and no guarding.           Assessment:       Migraine Headache- Vicodin or Excedrin migraine has needed  Abdominal pain-will do ultrasound, encouraged patient to schedule endoscopy  Hypertension-will hold off for now, patient states BP normal at home      Plan:       Refill Vicodin  Abdominal ultrasound  Will call with ultrasound results.

## 2011-09-28 ENCOUNTER — Encounter (INDEPENDENT_AMBULATORY_CARE_PROVIDER_SITE_OTHER): Payer: Self-pay | Admitting: Family Medicine

## 2011-10-07 ENCOUNTER — Encounter (INDEPENDENT_AMBULATORY_CARE_PROVIDER_SITE_OTHER): Payer: Self-pay

## 2011-12-05 ENCOUNTER — Ambulatory Visit
Admission: RE | Admit: 2011-12-05 | Discharge: 2011-12-05 | Disposition: A | Discharge status: Home / Self Care | Payer: Self-pay | Source: Ambulatory Visit | Attending: Gastroenterology | Admitting: Gastroenterology

## 2011-12-05 ENCOUNTER — Other Ambulatory Visit: Payer: Self-pay | Admitting: Gastroenterology

## 2011-12-05 DIAGNOSIS — R945 Abnormal results of liver function studies: Secondary | ICD-10-CM

## 2011-12-05 DIAGNOSIS — R1013 Epigastric pain: Secondary | ICD-10-CM

## 2011-12-05 DIAGNOSIS — R7989 Other specified abnormal findings of blood chemistry: Secondary | ICD-10-CM | POA: Insufficient documentation

## 2015-06-01 ENCOUNTER — Ambulatory Visit (INDEPENDENT_AMBULATORY_CARE_PROVIDER_SITE_OTHER): Payer: BLUE CROSS/BLUE SHIELD | Admitting: Urology

## 2015-06-01 ENCOUNTER — Encounter (INDEPENDENT_AMBULATORY_CARE_PROVIDER_SITE_OTHER): Payer: Self-pay | Admitting: Urology

## 2015-06-01 VITALS — BP 147/86 | HR 78 | Ht 60.0 in | Wt 128.0 lb

## 2015-06-01 DIAGNOSIS — N3090 Cystitis, unspecified without hematuria: Secondary | ICD-10-CM

## 2015-06-01 LAB — POCT URINALYSIS AUTOMATED (IAH)
Bilirubin, UA POCT: NEGATIVE
Glucose, UA POCT: NEGATIVE
Ketones, UA POCT: NEGATIVE mg/dL
Nitrite, UA POCT: NEGATIVE
PH, UA POCT: 6 (ref 4.6–8)
Protein, UA POCT: NEGATIVE mg/dL
Specific Gravity, UA POCT: 1.02 mg/dL (ref 1.001–1.035)
Urine Leukocytes POCT: NEGATIVE
Urobilinogen, UA POCT: 0.2 mg/dL

## 2015-06-01 MED ORDER — NITROFURANTOIN MONOHYD MACRO 100 MG PO CAPS
100.0000 mg | ORAL_CAPSULE | Freq: Two times a day (BID) | ORAL | Status: AC
Start: 2015-06-01 — End: 2015-06-24

## 2015-06-01 MED ORDER — METHENAMINE HIPPURATE 1 G PO TABS
1.0000 g | ORAL_TABLET | Freq: Every evening | ORAL | Status: AC
Start: 2015-06-01 — End: 2015-08-30

## 2015-06-01 NOTE — Patient Instructions (Signed)
Will get renal bladder US.  Will start hipprex 1 gm at night.  Will start macrobid, start one tab twice a day for 2-3 days when the symptoms of a uti start.  Follow up in 6 months to see how its going.

## 2015-06-01 NOTE — Progress Notes (Signed)
Subjective:      Patient ID: Kim Hendricks is a 64 y.o. female     Chief Complaint:  3 E. Coli uti's since OCt 2016. Was on bactrim ds for two course, then switched to keflex and is still on keflex. Today feels normal.   Had a cysto last month that was ok.   uti's were not related to intercourse. No sexually actively.  Had a remote hx of kidney stones about 30 years ago.    Ct in mar 2016 was negative except for adrenal adenoma.    Her bladder infections present with bladder pain and pressure and dysuria.    Just started vaginal estrogen cream last week.    No renal bladder imaging. She has been taking align probiotic.    The following portions of the patient's history were reviewed and updated as appropriate: allergies, current medications, past family history, past medical history, past social history, past surgical history and problem list.    Review of Systems   Systems reviewed per the HPI and below:     History obtained from the patient     General ROS: Pt otherwise feeling well, no recent illness.       Ophthalmic ROS: negative for blurry vision or yellowing of the eyes     Allergy and Immunology ROS: known/unknown allergies as described by the patient     Hematological and Lymphatic ROS: No known bleeding/clotting disorders     Endocrine ROS: no significant hot/cold spells     Respiratory ROS: no cough, shortness of breath, or wheezing     Cardiovascular ROS: no chest pain or dyspnea on exertion     Gastrointestinal ROS: no abdominal pain, change in bowel habits     Genito-Urinary ROS: see hpi     Musculoskeletal ROS:  no swelling to lower extremities, no back pain     Neurological ROS: no focal weakness     Dermatological ROS: no new rashes or lesions       Objective:   BP 147/86 mmHg  Pulse 78  Ht 1.524 m (5')  Wt 58.06 kg (128 lb)  BMI 25.00 kg/m2  General appearance - alert, well appearing, and in no distress  Head: Normocephalic and atraumatic  Neuro - alert, oriented to person, place, and time,  appropriate affect  Skin: Normal temperature, turgor and texture; no rash or ulcers  Eyes - Conjunctiva/corneas clear, EOM's intact  Neck - Supple, symmetrical, trachea midline, NROM   Chest - No labored breathing  Abdomen - soft, nontender, nondistended, no masses     Musculoskeletal: Normal range of motion.     Lab Review   Urine analysis shows no blood, no LE, no nitrate      Assessment:     1. Recurrent bacterial cystitis  POCT UA Clinitek AX (urine dipstick)    US Renal Kidney Bladder Complete        Plan:   Patient Instructions   Will get renal bladder US.  Will start hipprex 1 gm at night.  Will start macrobid, start one tab twice a day for 2-3 days when the symptoms of a uti start.  Follow up in 6 months to see how its going.          Orders  Orders Placed This Encounter   Procedures   . US Renal Kidney Bladder Complete     Standing Status: Future      Number of Occurrences:       Standing Expiration  Date: 05/31/2016     Order Specific Question:  Reason for Exam:     Answer:  frequent uti.   Marland Kitchen POCT UA Clinitek AX (urine dipstick)

## 2015-06-11 ENCOUNTER — Other Ambulatory Visit (INDEPENDENT_AMBULATORY_CARE_PROVIDER_SITE_OTHER): Payer: Self-pay | Admitting: Urology

## 2015-06-11 ENCOUNTER — Other Ambulatory Visit: Payer: Self-pay | Admitting: Urology

## 2015-06-11 DIAGNOSIS — N3090 Cystitis, unspecified without hematuria: Secondary | ICD-10-CM

## 2015-06-16 NOTE — Progress Notes (Signed)
Quick Note:    Tell patient ultrasound looked good  ______

## 2015-07-07 ENCOUNTER — Ambulatory Visit (INDEPENDENT_AMBULATORY_CARE_PROVIDER_SITE_OTHER): Payer: BLUE CROSS/BLUE SHIELD

## 2015-07-07 DIAGNOSIS — A499 Bacterial infection, unspecified: Secondary | ICD-10-CM

## 2015-07-07 DIAGNOSIS — N390 Urinary tract infection, site not specified: Secondary | ICD-10-CM

## 2015-07-07 LAB — POCT URINALYSIS AUTOMATED (IAH)
Bilirubin, UA POCT: NEGATIVE
Glucose, UA POCT: 100 — AB
Ketones, UA POCT: NEGATIVE mg/dL
Nitrite, UA POCT: POSITIVE — AB
PH, UA POCT: 5.5 (ref 4.6–8)
Protein, UA POCT: 100 mg/dL — AB
Specific Gravity, UA POCT: 1.015 mg/dL (ref 1.001–1.035)
Urobilinogen, UA POCT: 0.2 mg/dL

## 2015-07-07 MED ORDER — CEFUROXIME AXETIL 500 MG PO TABS
500.0000 mg | ORAL_TABLET | Freq: Two times a day (BID) | ORAL | Status: AC
Start: 2015-07-07 — End: 2015-07-14

## 2015-07-07 NOTE — Progress Notes (Signed)
Check urine culture  Begin Ceftin BID  Hold preventative antibiotic while on Ceftin  Follow up with Dr Walker Shadow

## 2015-07-07 NOTE — Progress Notes (Signed)
Patient here for urine drop off.  Patient c/o frequency, blood, and pain.  Urine sent for culture.    Pt informed.

## 2015-07-09 LAB — URINE CULTURE

## 2015-07-16 ENCOUNTER — Ambulatory Visit (INDEPENDENT_AMBULATORY_CARE_PROVIDER_SITE_OTHER): Payer: BLUE CROSS/BLUE SHIELD | Admitting: Urology

## 2015-07-16 ENCOUNTER — Encounter (INDEPENDENT_AMBULATORY_CARE_PROVIDER_SITE_OTHER): Payer: Self-pay | Admitting: Urology

## 2015-07-16 VITALS — BP 136/79 | HR 69 | Ht 60.0 in | Wt 128.0 lb

## 2015-07-16 DIAGNOSIS — N3090 Cystitis, unspecified without hematuria: Secondary | ICD-10-CM

## 2015-07-16 LAB — POCT URINALYSIS AUTOMATED (IAH)
Bilirubin, UA POCT: NEGATIVE
Glucose, UA POCT: NEGATIVE
Ketones, UA POCT: NEGATIVE mg/dL
Nitrite, UA POCT: NEGATIVE
PH, UA POCT: 6 (ref 4.6–8)
Protein, UA POCT: NEGATIVE mg/dL
Specific Gravity, UA POCT: 1.02 mg/dL (ref 1.001–1.035)
Urine Leukocytes POCT: NEGATIVE
Urobilinogen, UA POCT: 0.2 mg/dL

## 2015-07-16 NOTE — Patient Instructions (Addendum)
REcurrent uti;s  Continue hiprex 1 gm qhs,  Cont estrace.  Will start theracran, one tab twice day.  Patient a little frustrated, I offered second opinions.  May self tx with macrobid when she started to get rx.

## 2015-07-16 NOTE — Progress Notes (Signed)
Subjective:      Patient ID: Kim Hendricks is a 65 y.o. female     Chief Complaint:  Most recent culture was Jul 07, 2015 was e. Coli. She took macrobid that she was given at the last visit. Took it for a week. Symptoms resolved now.  She was placed on hipprex for a month and got her most recent uti while on hipprex.     3 E. Coli uti's since OCt 2016.   Had a cysto in the fall 2016 that was ok.   uti's were not related to intercourse. No sexually actively.  Had a remote hx of kidney stones about 30 years ago.    Ct in mar 2016 was negative except for adrenal adenoma.    Her bladder infections present with bladder pain and pressure and dysuria.    Just started vaginal estrogen cream fall 2016. Using twice a week.    No renal bladder imaging. She has been taking align probiotic.    The following portions of the patient's history were reviewed and updated as appropriate: allergies, current medications, past family history, past medical history, past social history, past surgical history and problem list.    Review of Systems   General ROS: Pt otherwise feeling well, no recent illness no fevers, no chills   Gastrointestinal ROS: no abdominal pain, change in bowel habits   Genito-Urinary ROS: see hpi   Musculoskeletal ROS:  no swelling to lower extremities, no back pain         Objective:   BP 136/79 mmHg  Pulse 69  Ht 1.524 m (5')  Wt 58.06 kg (128 lb)  BMI 25.00 kg/m2  General appearance - alert, well appearing, and in no distress  Neuro - alert, oriented to person, place, and time, appropriate affect  Skin: Normal temperature, turgor and texture; no rash or ulcers  Eyes - Conjunctiva/corneas clear, EOM's intact  Neck - Supple, symmetrical, trachea midline, NROM   Chest - No labored breathing  Abdomen - soft, nontender, nondistended, no masses       Lab Review   Urine analysis shows mod blood, no LE, no nitrate      Assessment:     1. Recurrent bacterial cystitis  POCT UA Clinitek AX (urine dipstick)    Urine  culture        Plan:   Patient Instructions   REcurrent uti;s  Continue hiprex 1 gm qhs,  Cont estrace.  Will start theracran, one tab twice day.  Patient a little frustrated, I offered second opinions.  May self tx with macrobid when she started to get rx.            Orders  Orders Placed This Encounter   Procedures   . Urine culture   . POCT UA Clinitek AX (urine dipstick)

## 2015-07-22 ENCOUNTER — Ambulatory Visit (INDEPENDENT_AMBULATORY_CARE_PROVIDER_SITE_OTHER): Payer: BLUE CROSS/BLUE SHIELD | Admitting: Urology

## 2015-09-18 ENCOUNTER — Encounter (INDEPENDENT_AMBULATORY_CARE_PROVIDER_SITE_OTHER): Payer: Self-pay | Admitting: Urology

## 2015-09-24 ENCOUNTER — Telehealth (INDEPENDENT_AMBULATORY_CARE_PROVIDER_SITE_OTHER): Payer: Self-pay | Admitting: Urology

## 2015-09-24 NOTE — Telephone Encounter (Signed)
Well, I was in a lot of pain and could hardly drive to get AZO medicine at the store, so I just started taking the macrobid that Dr. Walker Shadow prescribed with refills for when I get an infection.  It must be working because the pain subsided and I'm not having symptoms while on the antibiotic. I just am worried about why it keeps happening.. Does the antibiotic only kill some of the bacteria and then it grows back? I have an appointment in June with Dr. Walker Shadow and I just wanted it noted in my chart that I've continued to have infections inspite of using the vaginal estrogen replacemen, using wet wipes for bowel movements as well as taking the cranberry supplement.     This above email was sent by pt.

## 2015-10-02 ENCOUNTER — Ambulatory Visit (INDEPENDENT_AMBULATORY_CARE_PROVIDER_SITE_OTHER): Payer: BLUE CROSS/BLUE SHIELD

## 2015-10-02 DIAGNOSIS — N3090 Cystitis, unspecified without hematuria: Secondary | ICD-10-CM

## 2015-10-02 LAB — URINE MICROSCOPIC

## 2015-10-02 LAB — POCT URINALYSIS AUTOMATED (IAH)
Bilirubin, UA POCT: NEGATIVE
Glucose, UA POCT: NEGATIVE
Ketones, UA POCT: NEGATIVE mg/dL
Nitrite, UA POCT: NEGATIVE
PH, UA POCT: 7 (ref 4.6–8)
Protein, UA POCT: NEGATIVE mg/dL
Specific Gravity, UA POCT: 1.01 mg/dL (ref 1.001–1.035)
Urobilinogen, UA POCT: 0.2 mg/dL

## 2015-10-02 NOTE — Progress Notes (Signed)
Patient left a urine specimen, complaining of "pain & blood"  Patient informed of urine results, urine going out for culture, treatment pending, start on a probiotic.

## 2015-10-03 ENCOUNTER — Encounter (INDEPENDENT_AMBULATORY_CARE_PROVIDER_SITE_OTHER): Payer: Self-pay | Admitting: Urology

## 2015-11-26 ENCOUNTER — Other Ambulatory Visit (FREE_STANDING_LABORATORY_FACILITY): Payer: BLUE CROSS/BLUE SHIELD

## 2015-11-26 ENCOUNTER — Ambulatory Visit (INDEPENDENT_AMBULATORY_CARE_PROVIDER_SITE_OTHER): Payer: BLUE CROSS/BLUE SHIELD | Admitting: Urology

## 2015-11-26 ENCOUNTER — Encounter (INDEPENDENT_AMBULATORY_CARE_PROVIDER_SITE_OTHER): Payer: Self-pay | Admitting: Urology

## 2015-11-26 VITALS — BP 134/82 | HR 72 | Ht 60.0 in | Wt 128.0 lb

## 2015-11-26 DIAGNOSIS — N3090 Cystitis, unspecified without hematuria: Secondary | ICD-10-CM

## 2015-11-26 NOTE — Patient Instructions (Signed)
Doing relatively well on intermittent treatment with macrobid, may consider starting D-mannose to help with uti prevention.    Follow up yearly or sooner with symptomatic uti's.

## 2015-11-26 NOTE — Progress Notes (Signed)
Subjective:      Patient ID: Kim Hendricks is a 65 y.o. female     Chief Complaint:  Had run of uti in fall 2016, since then has been doing relatively well. Currently taking theracran  Stopped hipprex  Apr had some dysuria and again in may, and took macrobid for a few days that resolved the symptoms. i gave her this to self treat.  No fevers or hematuria.  Also still on estrace.    The following portions of the patient's history were reviewed and updated as appropriate: allergies, current medications, past family history, past medical history, past social history, past surgical history and problem list.    Review of Systems   General ROS: Pt otherwise feeling well, no recent illness no fevers, no chills   Gastrointestinal ROS: no abdominal pain, change in bowel habits   Genito-Urinary ROS: see hpi   Musculoskeletal ROS:  no swelling to lower extremities, no back pain         Objective:   BP 134/82 mmHg  Pulse 72  Ht 1.524 m (5')  Wt 58.06 kg (128 lb)  BMI 25.00 kg/m2  General appearance - alert, well appearing, and in no distress  Neuro - alert, oriented to person, place, and time, appropriate affect  Skin: Normal temperature, turgor and texture; no rash or ulcers  Eyes - Conjunctiva/corneas clear, EOM's intact  Neck - Supple, symmetrical, trachea midline, NROM   Chest - No labored breathing  Abdomen - soft, nontender, nondistended, no masses       Lab Review   Urine analysis shows tr lysed blood, no LE, no nitrate      Assessment:     1. Recurrent bacterial cystitis          Plan:   Patient Instructions   Doing relatively well on intermittent treatment with macrobid, may consider starting D-mannose to help with uti prevention.    Follow up yearly or sooner with symptomatic uti's.        Orders  No orders of the defined types were placed in this encounter.

## 2015-11-27 LAB — POCT URINALYSIS AUTOMATED (IAH)
Bilirubin, UA POCT: NEGATIVE
Glucose, UA POCT: NEGATIVE
Ketones, UA POCT: NEGATIVE mg/dL
Nitrite, UA POCT: NEGATIVE
PH, UA POCT: 6.5 (ref 4.6–8)
Protein, UA POCT: NEGATIVE mg/dL
Specific Gravity, UA POCT: 1.015 mg/dL (ref 1.001–1.035)
Urine Leukocytes POCT: NEGATIVE
Urobilinogen, UA POCT: 0.2 mg/dL

## 2015-11-30 ENCOUNTER — Ambulatory Visit (INDEPENDENT_AMBULATORY_CARE_PROVIDER_SITE_OTHER): Payer: PRIVATE HEALTH INSURANCE | Admitting: Urology

## 2015-12-01 ENCOUNTER — Other Ambulatory Visit (FREE_STANDING_LABORATORY_FACILITY): Payer: BLUE CROSS/BLUE SHIELD

## 2015-12-01 ENCOUNTER — Telehealth (INDEPENDENT_AMBULATORY_CARE_PROVIDER_SITE_OTHER): Payer: Self-pay

## 2015-12-01 ENCOUNTER — Ambulatory Visit (INDEPENDENT_AMBULATORY_CARE_PROVIDER_SITE_OTHER): Payer: BLUE CROSS/BLUE SHIELD

## 2015-12-01 DIAGNOSIS — A499 Bacterial infection, unspecified: Secondary | ICD-10-CM

## 2015-12-01 DIAGNOSIS — N390 Urinary tract infection, site not specified: Secondary | ICD-10-CM

## 2015-12-01 MED ORDER — CEFDINIR 300 MG PO CAPS
300.0000 mg | ORAL_CAPSULE | Freq: Two times a day (BID) | ORAL | Status: AC
Start: 2015-12-01 — End: 2015-12-08

## 2015-12-01 NOTE — Telephone Encounter (Signed)
Dr. Walker Shadow,  Ms. Staver left a urine specimen today, and was put on Ceftin, she called back stating now she is having kidney pain  Please advise    Thanks Selena Batten

## 2015-12-01 NOTE — Progress Notes (Signed)
Patient is c/o frequency, painful urination, might have of seen blood  Urine culture ordered  Begin Omnicef  Pt informed

## 2015-12-04 NOTE — Telephone Encounter (Signed)
Left message

## 2015-12-08 NOTE — Telephone Encounter (Signed)
uti sensitive to cefitn

## 2016-03-31 ENCOUNTER — Encounter (INDEPENDENT_AMBULATORY_CARE_PROVIDER_SITE_OTHER): Payer: Self-pay | Admitting: Urology

## 2016-04-12 ENCOUNTER — Encounter (INDEPENDENT_AMBULATORY_CARE_PROVIDER_SITE_OTHER): Payer: Self-pay | Admitting: Nurse Practitioner

## 2016-04-12 ENCOUNTER — Ambulatory Visit (INDEPENDENT_AMBULATORY_CARE_PROVIDER_SITE_OTHER): Payer: BLUE CROSS/BLUE SHIELD | Admitting: Nurse Practitioner

## 2016-04-12 VITALS — Ht 60.0 in | Wt 128.0 lb

## 2016-04-12 DIAGNOSIS — N3090 Cystitis, unspecified without hematuria: Secondary | ICD-10-CM

## 2016-04-12 LAB — URINALYSIS POC
POCT Urine Bilirubin: NEGATIVE
POCT Urine Glucose: NEGATIVE mg/dL
POCT Urine Ketones: NEGATIVE mg/dL
POCT Urine Nitrites: NEGATIVE
POCT Urine Urobilibogen: 0.2 mg/dL (ref 0.0–1.0)
POCT Urine pH: 5.5 (ref 5.0–8.0)
Protein, UR POCT: NEGATIVE mg/dL
Urine Specific Gravity POC: <=1.005 (ref 1.001–1.035)

## 2016-04-12 NOTE — Patient Instructions (Signed)
Take the current prescription of Keflex, 250mg  twice daily  Recheck urine culture about 2 days after completing the Keflex  Will plan to provide long term RX for Keflex 250 daily if the urine culture is negative.   Continue Estrace and probiotics  Maintain hydration  Follow up with Dr Walker Shadow

## 2016-04-12 NOTE — Progress Notes (Signed)
Subjective:      Patient ID: Kim Hendricks is a 65 y.o. female     Chief Complaint:  Pt has a history of UTIs. Had run of uti in fall 2016, since then has been doing relatively well. Currently taking theracran  Stopped hiprex - not effective. Switched to D Mannose; however, she continues to develop infections.   She has self-treated with Macrobid in the past. No fevers or hematuria.  Also still on estrace.    Seen by PCP, treated for + Proteus infection from 03/21/16, with Macrobid and then switched to Keflex.   Has difficulty taking antibiotics, unable to Cipro or Levaquin. Or Ceftin. 8 UTIs in the past year.      Cysto and RBUS were normal about one year ago.  05/2015 RBUS showed no stones, no hydro, PVR 11cc      The following portions of the patient's history were reviewed and updated as appropriate: allergies, current medications, past family history, past medical history, past social history, past surgical history and problem list.    Review of Systems  Systems reviewed per the HPI and below:     History obtained from the patient     General ROS: no fevers, or chills     Gastrointestinal ROS: no abdominal pain, change in bowel habits     Musculoskeletal ROS:  no swelling to lower extremities, no back pain     Objective:   Ht 1.524 m (5')   Wt 58.1 kg (128 lb)   BMI 25.00 kg/m    vital signs reviewed  Physical Exam   Patient was offered chaperone during examination. Patient declined  Constitutional: Pt is oriented to person, place, and time and well-developed, well-nourished, and in no distress.   Pulmonary/Chest: Effort normal.   Abdominal: Soft. Pt exhibits no distension and no mass. There is no tenderness. There is no rebound and no guarding.   Musculoskeletal: Normal range of motion.         Lab Review   Urinalysis shows trace LEU, small blood    Radiology Review   05/2015 RBUS showed no stones, no hydro, PVR 11cc          Assessment:     1. Recurrent bacterial cystitis             Plan:   Patient  Instructions   Take the current prescription of Keflex, 250mg  twice daily  Recheck urine culture about 2 days after completing the Keflex  Will plan to provide long term RX for Keflex 250 daily if the urine culture is negative.   Continue Estrace and probiotics  Maintain hydration  Follow up with Dr Walker Shadow      Orders  Orders Placed This Encounter   Procedures   . Urinalysis POC     Ordered by an unspecified provider

## 2016-04-13 ENCOUNTER — Encounter (INDEPENDENT_AMBULATORY_CARE_PROVIDER_SITE_OTHER): Payer: Self-pay | Admitting: Urology

## 2016-04-20 ENCOUNTER — Other Ambulatory Visit (INDEPENDENT_AMBULATORY_CARE_PROVIDER_SITE_OTHER): Payer: Self-pay | Admitting: Nurse Practitioner

## 2016-04-20 ENCOUNTER — Encounter (INDEPENDENT_AMBULATORY_CARE_PROVIDER_SITE_OTHER): Payer: Self-pay | Admitting: Nurse Practitioner

## 2016-04-20 MED ORDER — CEPHALEXIN 250 MG PO CAPS
250.0000 mg | ORAL_CAPSULE | Freq: Every day | ORAL | 1 refills | Status: AC
Start: 2016-04-20 — End: ?

## 2016-04-25 ENCOUNTER — Ambulatory Visit (FREE_STANDING_LABORATORY_FACILITY): Payer: BLUE CROSS/BLUE SHIELD | Admitting: Nurse Practitioner

## 2016-04-25 DIAGNOSIS — N3090 Cystitis, unspecified without hematuria: Secondary | ICD-10-CM

## 2016-04-25 LAB — URINALYSIS POC
POCT Urine Bilirubin: NEGATIVE
POCT Urine Glucose: NEGATIVE mg/dL
POCT Urine Ketones: NEGATIVE mg/dL
POCT Urine Nitrites: NEGATIVE
POCT Urine Urobilibogen: 0.2 mg/dL (ref 0.0–1.0)
POCT Urine pH: 5.5 (ref 5.0–8.0)
Protein, UR POCT: NEGATIVE mg/dL
Urine Specific Gravity POC: <=1.005 (ref 1.001–1.035)
Urine leukocyte Esterase, POCT: NEGATIVE

## 2016-04-25 NOTE — Progress Notes (Signed)
Drop off UA after treatment  Negative LEU, small blood  Follow up with Dr Walker Shadow as needed

## 2016-04-25 NOTE — Progress Notes (Signed)
Patient drop off U/A as recheck after treatment

## 2016-05-11 ENCOUNTER — Encounter (INDEPENDENT_AMBULATORY_CARE_PROVIDER_SITE_OTHER): Payer: Self-pay | Admitting: Urology

## 2016-05-16 ENCOUNTER — Ambulatory Visit (INDEPENDENT_AMBULATORY_CARE_PROVIDER_SITE_OTHER): Payer: BLUE CROSS/BLUE SHIELD | Admitting: Urology

## 2016-06-06 ENCOUNTER — Ambulatory Visit (INDEPENDENT_AMBULATORY_CARE_PROVIDER_SITE_OTHER): Payer: BLUE CROSS/BLUE SHIELD | Admitting: Urology

## 2016-06-08 ENCOUNTER — Ambulatory Visit (INDEPENDENT_AMBULATORY_CARE_PROVIDER_SITE_OTHER): Payer: BLUE CROSS/BLUE SHIELD | Admitting: Urology

## 2016-07-14 ENCOUNTER — Encounter (INDEPENDENT_AMBULATORY_CARE_PROVIDER_SITE_OTHER): Payer: Self-pay | Admitting: Urology

## 2016-07-14 ENCOUNTER — Ambulatory Visit (INDEPENDENT_AMBULATORY_CARE_PROVIDER_SITE_OTHER): Payer: BLUE CROSS/BLUE SHIELD | Admitting: Urology

## 2016-07-14 VITALS — Ht 60.0 in | Wt 128.0 lb

## 2016-07-14 DIAGNOSIS — N3090 Cystitis, unspecified without hematuria: Secondary | ICD-10-CM

## 2016-07-14 LAB — URINALYSIS WITH MICROSCOPIC
Bilirubin, UA: NEGATIVE
Glucose, UA: NEGATIVE
Ketones UA: NEGATIVE
Leukocyte Esterase, UA: NEGATIVE
Nitrite, UA: NEGATIVE
Protein, UR: NEGATIVE
Specific Gravity UA: 1.016 (ref 1.001–1.035)
Urine pH: 6 (ref 5.0–8.0)
Urobilinogen, UA: 0.2

## 2016-07-14 LAB — URINALYSIS POC
POCT Urine Bilirubin: NEGATIVE
POCT Urine Glucose: NEGATIVE mg/dL
POCT Urine Ketones: NEGATIVE mg/dL
POCT Urine Nitrites: NEGATIVE
POCT Urine Urobilibogen: 0.2 mg/dL (ref 0.0–1.0)
POCT Urine pH: 6 (ref 5.0–8.0)
Protein, UR POCT: NEGATIVE mg/dL
Urine Specific Gravity POC: 1.015 (ref 1.001–1.035)
Urine leukocyte Esterase, POCT: NEGATIVE

## 2016-07-14 NOTE — Patient Instructions (Signed)
Urine looks good today.  Will contineu D-mannose and premarin cream.  Follow up yearly, if infections stay away, but if they come back frequently may consider other options, like a month long rx again, because that seemed to help before.

## 2016-07-14 NOTE — Progress Notes (Signed)
Subjective:      Patient ID: Kim Hendricks is a 66 y.o. female     Chief Complaint:  Pt has a history of UTIs. Had run of uti in fall 2016, then had a break in uti, but in 2017 had about 8 uti.  Stopped hiprex - not effective. Switched to D Mannose; however, she continues to develop infections.   She has self-treated with Macrobid in the past. No fevers or hematuria.    She is taking a cranberry D-mannose mixture    She was placed on keflex 250 mg once a day for prophylaxis in mid nov to mid dec 2017 and has not had a uti since she stopped the daily keflex.  And stopped mid December and no uti symptoms of a uti since.  Also still on estrace.  .   Has difficulty taking antibiotics, unable to Cipro or Levaquin. Or Ceftin. 8 UTIs in the past year- 2017.     Cysto and RBUS were normal about one year ago.  05/2015 RBUS showed no stones, no hydro, PVR 11cc    The following portions of the patient's history were reviewed and updated as appropriate: allergies, current medications, past family history, past medical history, past social history, past surgical history and problem list.    Review of Systems   General ROS: Pt otherwise feeling well, no recent illness no fevers, no chills   Gastrointestinal ROS: no abdominal pain, change in bowel habits   Genito-Urinary ROS: see hpi   Musculoskeletal ROS:  no swelling to lower extremities, no back pain         Objective:   Ht 1.524 m (5')   Wt 58.1 kg (128 lb)   BMI 25.00 kg/m   General appearance - alert, well appearing, and in no distress  Neuro - alert, oriented to person, place, and time, appropriate affect  Skin: Normal temperature, turgor and texture; no rash or ulcers  Eyes - Conjunctiva/corneas clear, EOM's intact  Neck - Supple, symmetrical, trachea midline, NROM   Chest - No labored breathing  Abdomen - soft, nontender, nondistended, no masses       Lab Review   Urine analysis shows mod blood, no LE, no nitrate      Assessment:     1. Recurrent bacterial cystitis   Urinalysis with microscopic    Urine culture        Plan:   Patient Instructions   Urine looks good today.  Will contineu D-mannose and premarin cream.  Follow up yearly, if infections stay away, but if they come back frequently may consider other options, like a month long rx again, because that seemed to help before.      Orders  Orders Placed This Encounter   Procedures   . Urine culture   . Urinalysis POC   . Urinalysis with microscopic     Order Specific Question:   URINE TYPE     Answer:   Clean Catch

## 2016-07-19 ENCOUNTER — Encounter (INDEPENDENT_AMBULATORY_CARE_PROVIDER_SITE_OTHER): Payer: Self-pay | Admitting: Urology

## 2016-07-19 ENCOUNTER — Telehealth (INDEPENDENT_AMBULATORY_CARE_PROVIDER_SITE_OTHER): Payer: Self-pay | Admitting: Urology

## 2016-07-19 NOTE — Telephone Encounter (Signed)
Urine culture pos for enterococcus, but no symptoms. Will hold on any abx. Pt told.

## 2016-07-20 ENCOUNTER — Encounter (INDEPENDENT_AMBULATORY_CARE_PROVIDER_SITE_OTHER): Payer: Self-pay | Admitting: Urology

## 2017-04-19 ENCOUNTER — Encounter (INDEPENDENT_AMBULATORY_CARE_PROVIDER_SITE_OTHER): Payer: Self-pay

## 2017-09-26 IMAGING — MG MAMMOGRAPHY SCREENING BILATERAL 3D TOMOSYNTHESIS WITH CAD
8 series · 8 of 24 positions shown · non-contrast
Comparison: June 30, 2016.

MAMMOGRAPHY SCREENING BILATERAL 3D TOMOSYNTHESIS WITH CAD, 09/26/2017 [DATE]: 
CLINICAL INDICATION: Screening mammogram
TECHNIQUE: Digital bilateral mammograms and 3-D Tomosynthesis were obtained. 
These were interpreted both primarily and with the aid of computer-aided 
detection system.

[L MLO]
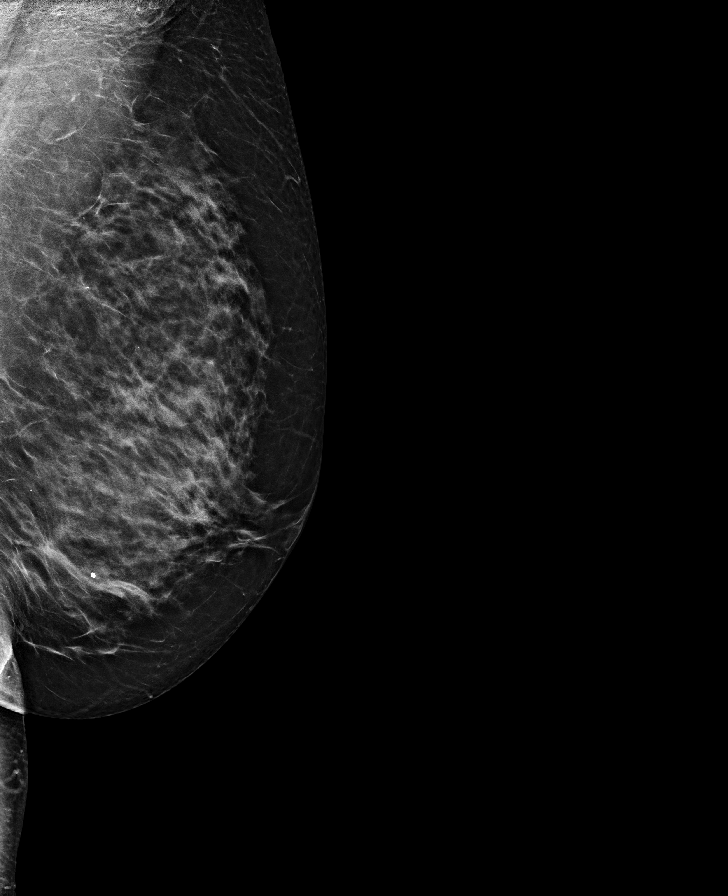

[R CC]
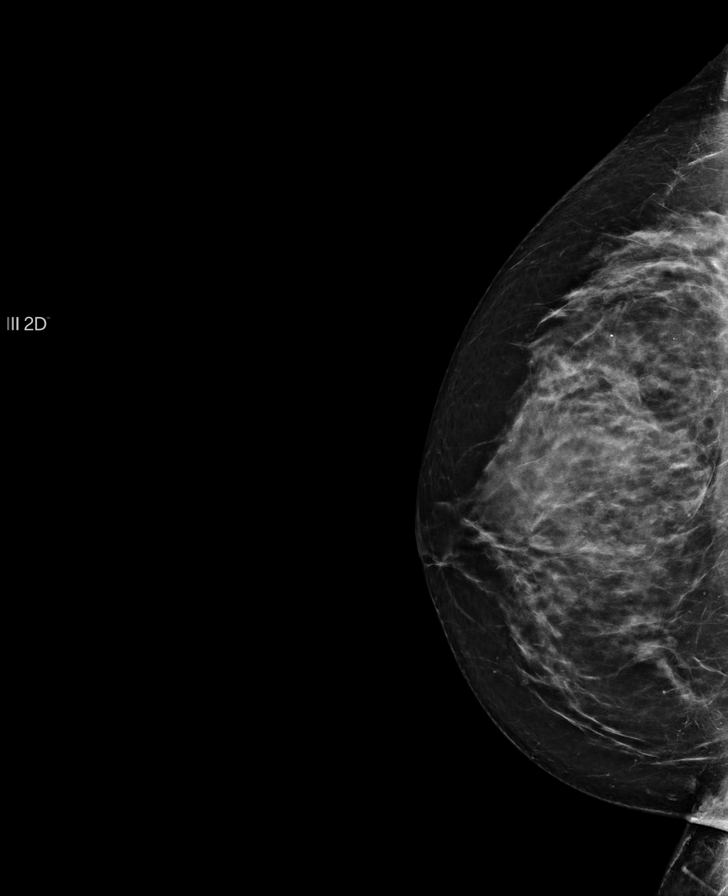

[R MLO]
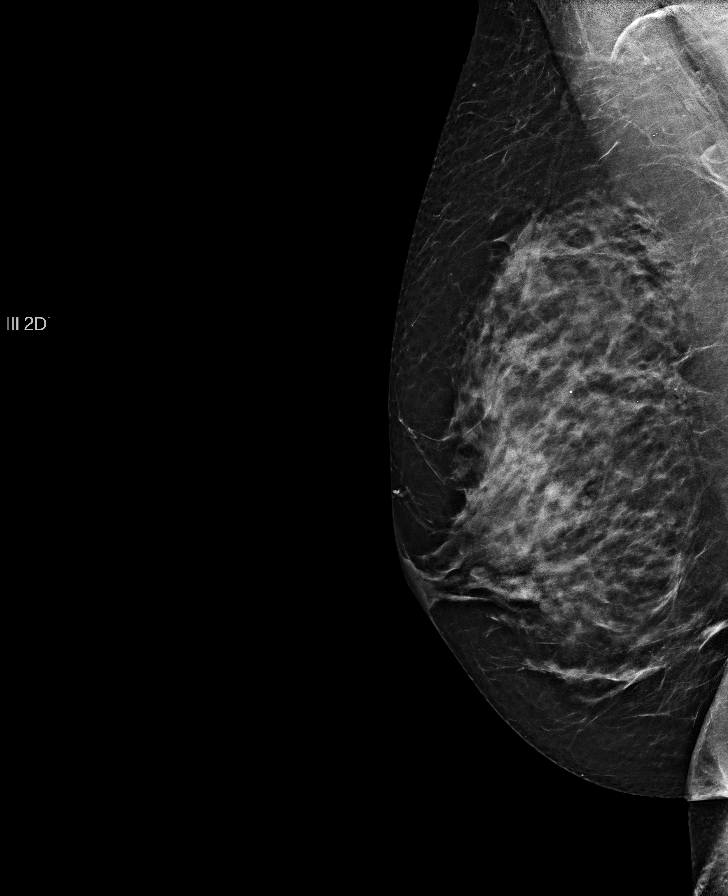

[L CC]
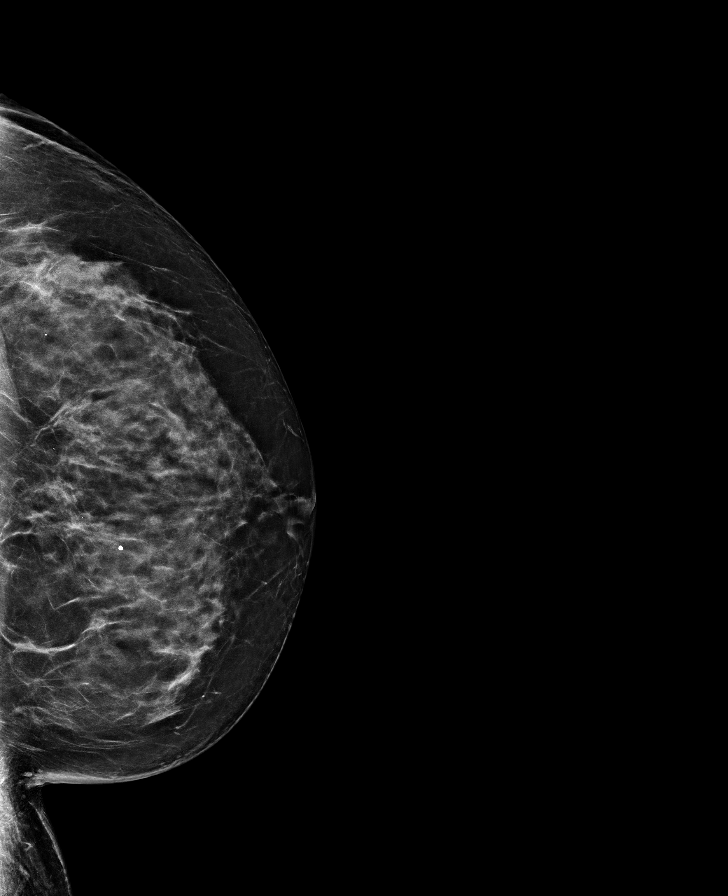

[L MLO tomo · tomo slice 41/82.0]
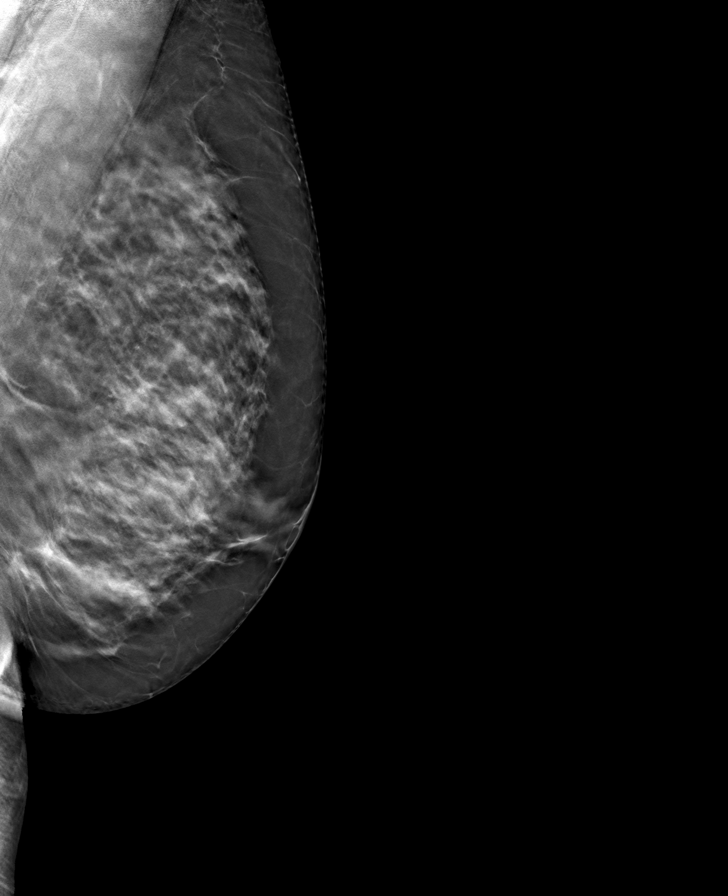

[L CC tomo · tomo slice 43/86.0]
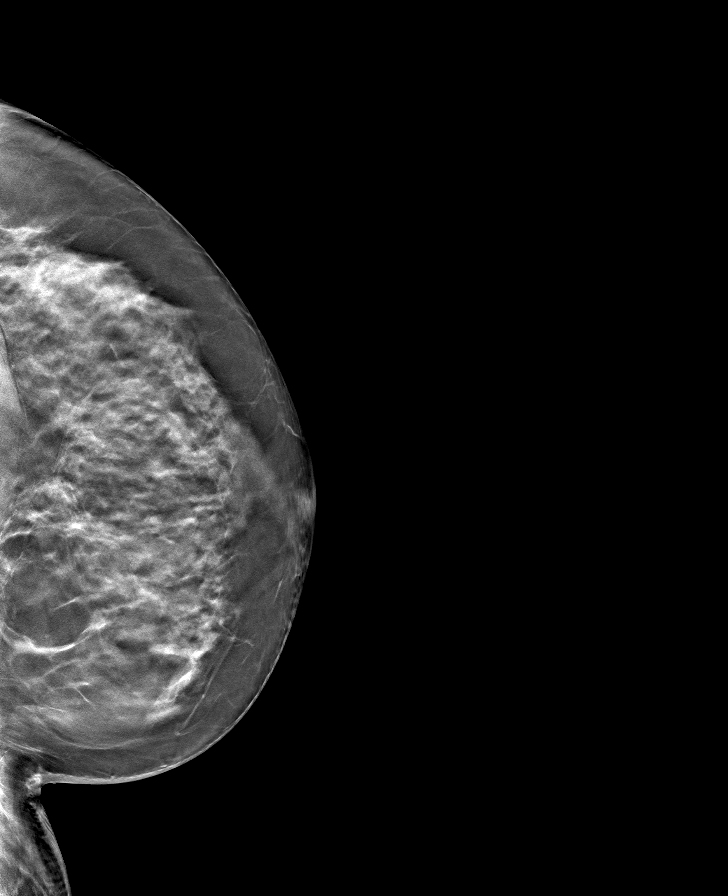

[R MLO tomo · tomo slice 40/79.0]
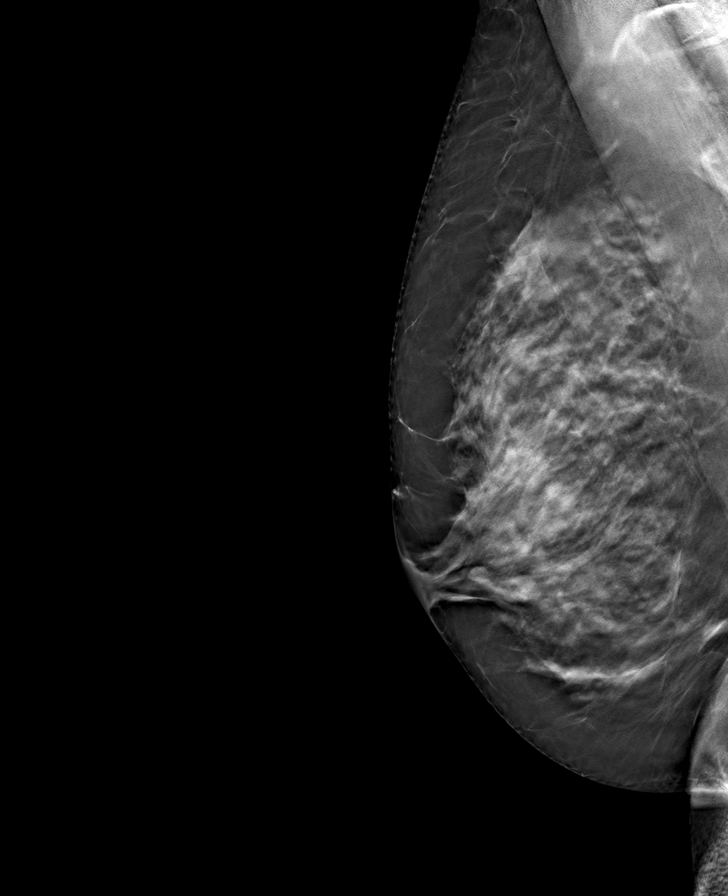

[R CC tomo · tomo slice 45/89.0]
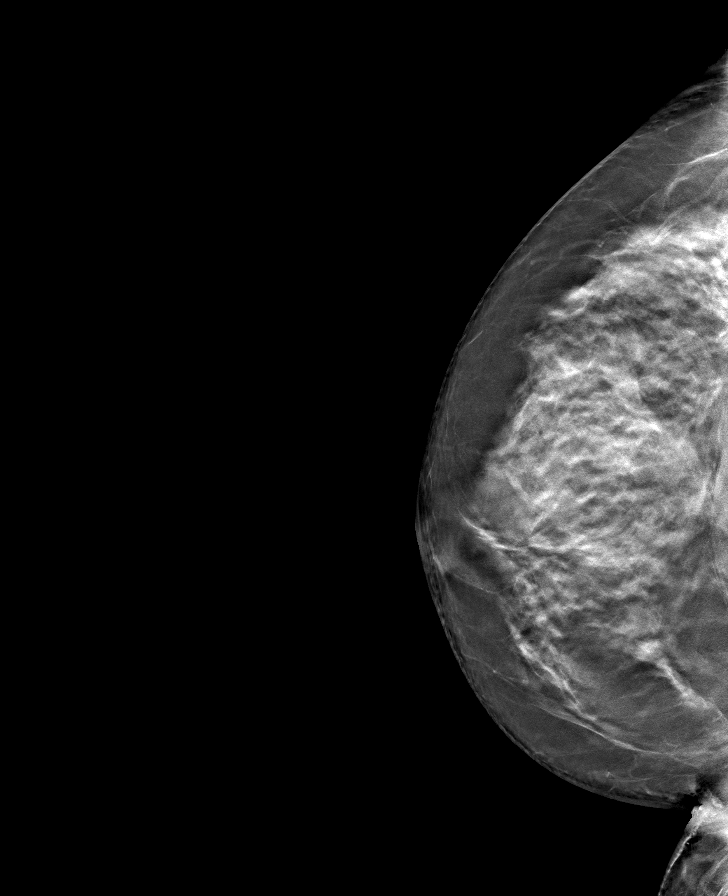

[8 of 24 positions shown; findings below may reference images not displayed]

BREAST DENSITY: (Level C) The breasts are heterogeneously dense, which may 
obscure small masses.
FINDINGS: No visible mass, tumor calcification or architectural distortion. No 
significant change from the prior mammogram.
IMPRESSION: ( BI-RADS 2) Benign findings. Routine mammographic follow-up is recommended.

## 2018-09-19 IMAGING — CT CT ABDOMEN AND PELVIS WITHOUT CONTRAST
2 of 3 series · 15 of 46 positions shown, 17 images · non-contrast
Comparison: There are no previous exams available for comparison.

CT ABDOMEN AND PELVIS WITHOUT CONTRAST, 09/19/2018 [DATE]: 
CLINICAL INDICATION:  Left lower quadrant pain, change in bowel habits, started 
antibiotics today 
A search for DICOM formatted images was conducted for prior CT imaging studies 
completed at a non-affiliated media free facility.
TECHNIQUE: The abdomen and pelvis were scanned from lung bases through the 
pubic rami without contrast on a high-resolution CT scanner using dose reduction 
techniques..  Routine MPR reconstructions were performed.

[Series 2: abd/pel ax wo · axial · 0.70mm/px · z∈[-367,-31]mm · 12 of 130 slices shown, 14 images]
[im 9/130  soft-tissue]
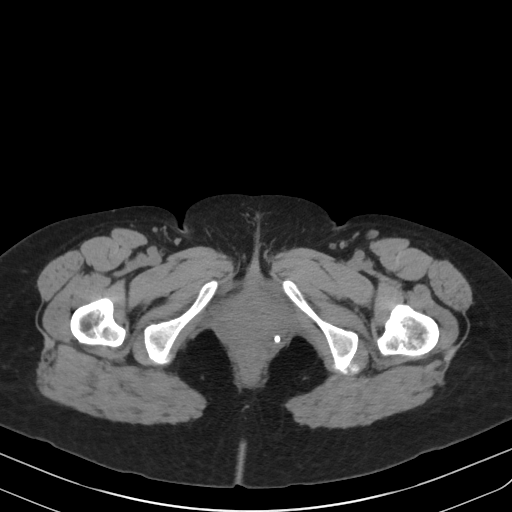
[im 9/130  bone]
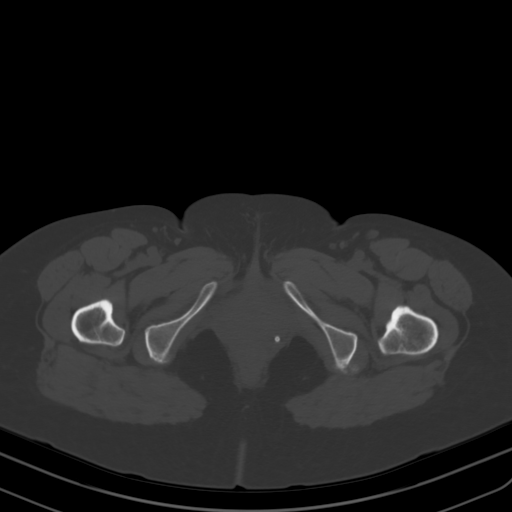
[im 17/130  soft-tissue]
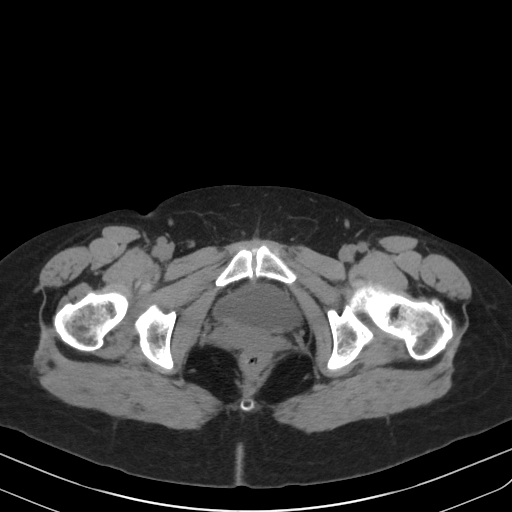
[im 30/130  soft-tissue]
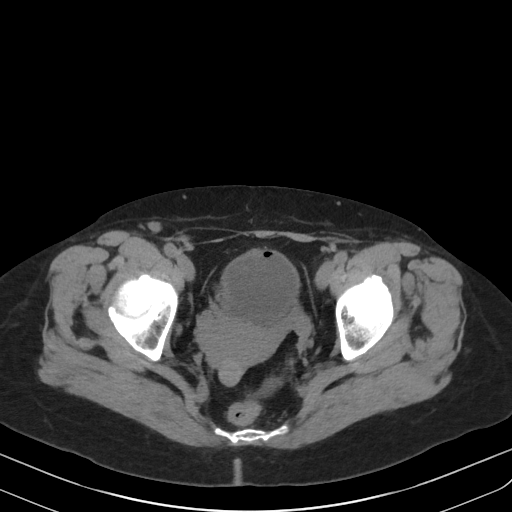
[im 38/130  soft-tissue]
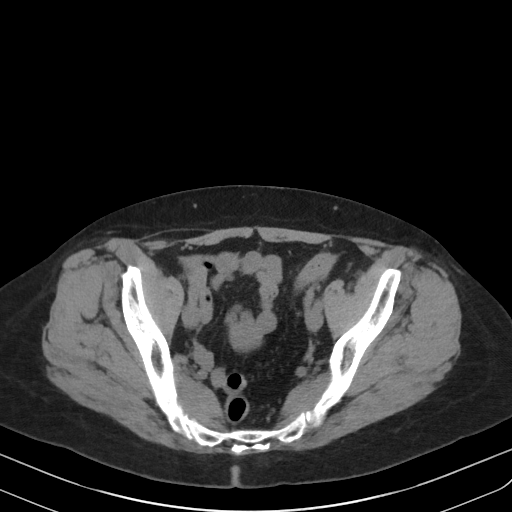
[im 50/130  soft-tissue]
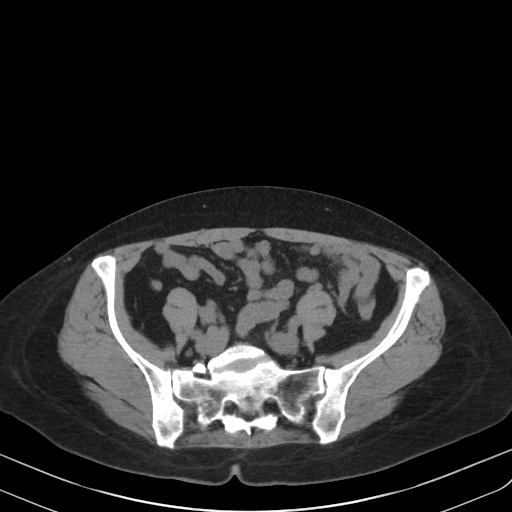
[im 59/130  soft-tissue]
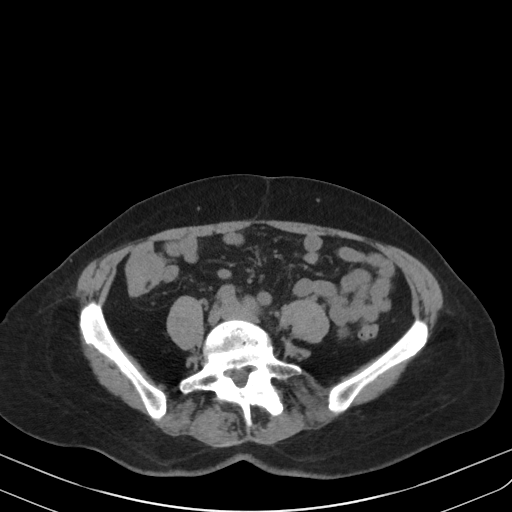
[im 71/130  soft-tissue]
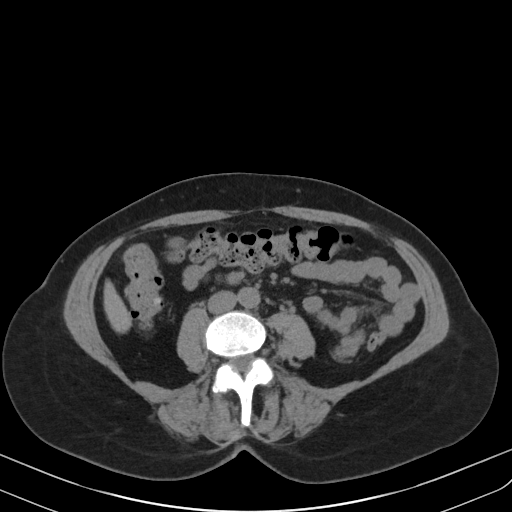
[im 80/130  soft-tissue]
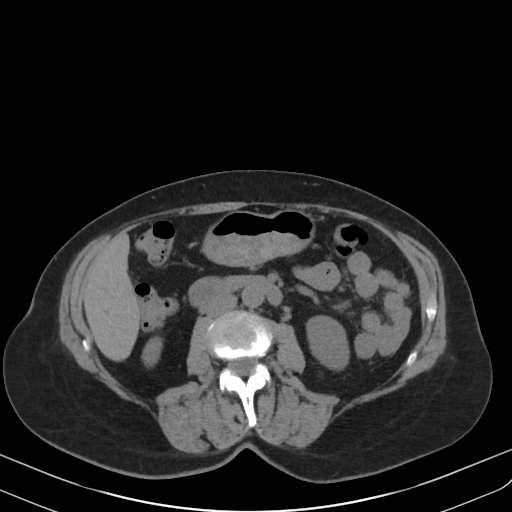
[im 92/130  soft-tissue]
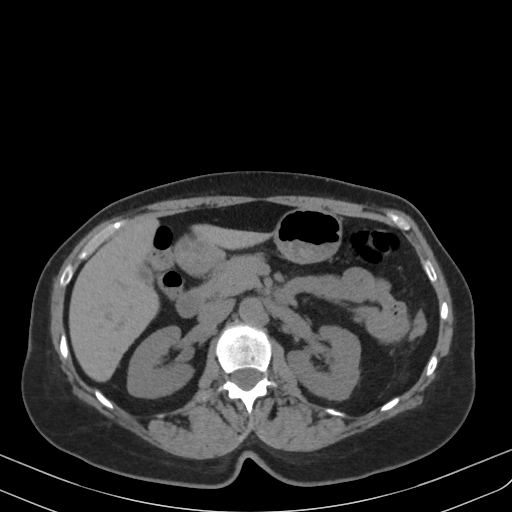
[im 92/130  bone]
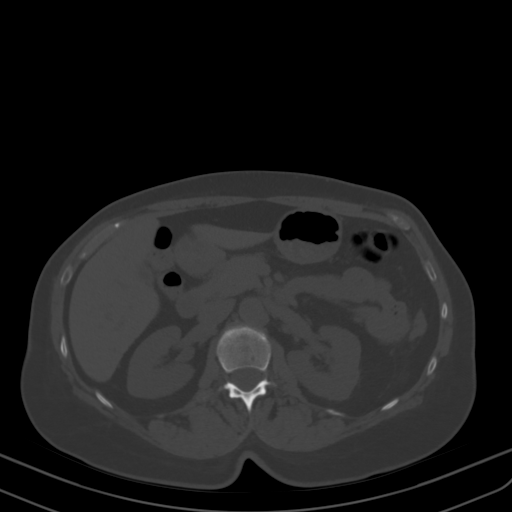
[im 100/130  soft-tissue]
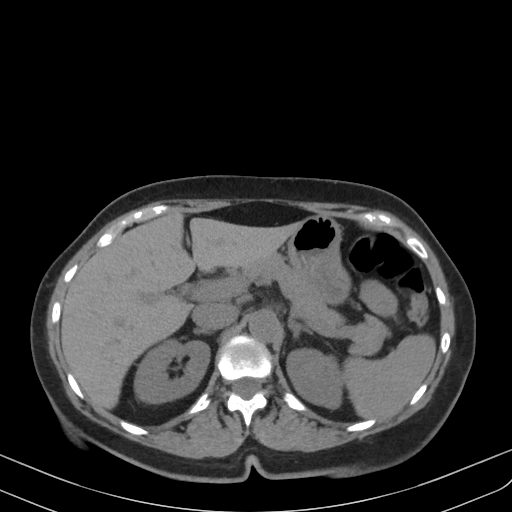
[im 113/130  soft-tissue]
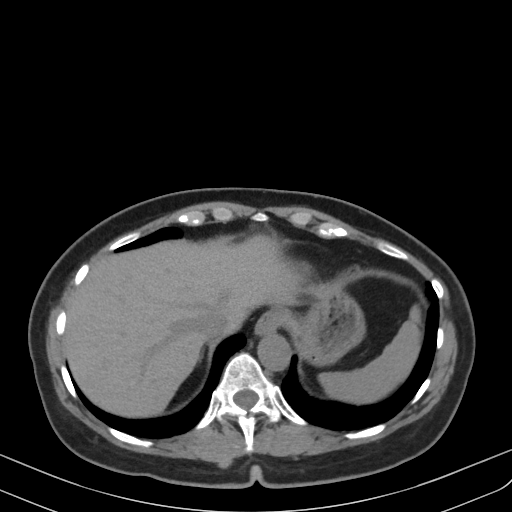
[im 121/130  soft-tissue]
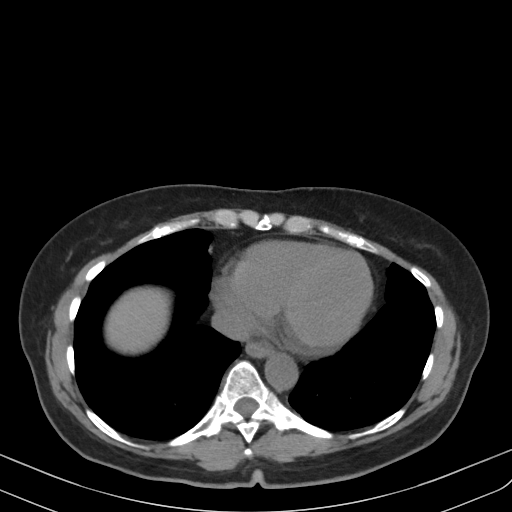

[Series 3: abd/pel cor wo · coronal · 0.63mm/px · 3 of 95 slices shown]
[im 32/95  soft-tissue]
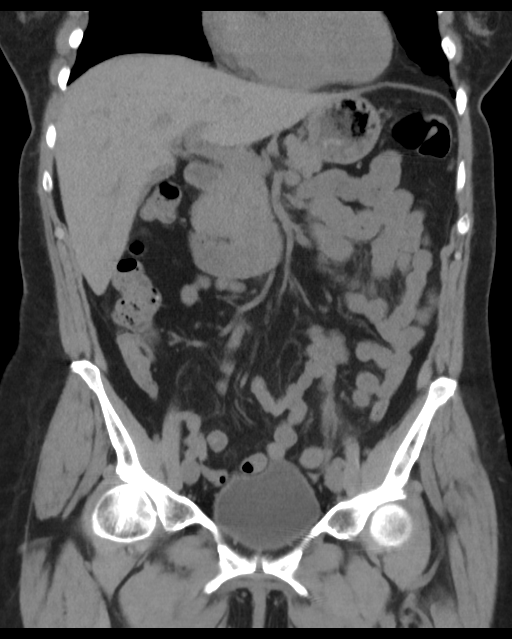
[im 42/95  soft-tissue]
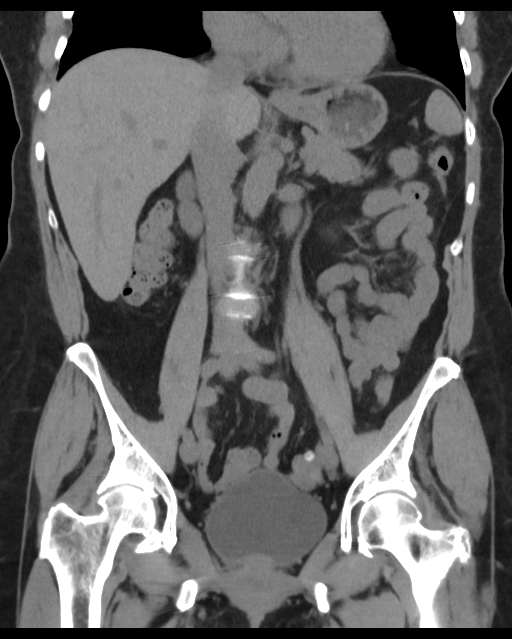
[im 53/95  soft-tissue]
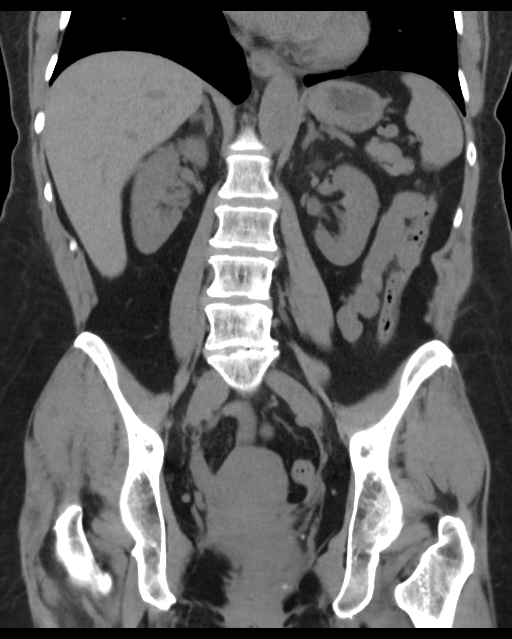

[15 of 46 positions shown; findings below may reference images not displayed]

FINDINGS: Lung bases are clear. Heart is normal in size without pericardial effusion. 
Liver is normal in size and contour. No intrahepatic biliary ductal dilation. 
Gallbladder is decompressed. 
Spleen appears normal. 
Adrenal glands normal in size and configuration. 
The pancreas demonstrates no ductal dilation or mass lesion. 
Kidneys are within normal limits of size and contour. No hydronephrosis. No 
nephrolithiasis. 
No hydroureter or ureterolithiasis. The hydroureter or ureterolithiasis. There 
is a phlebolith along the course of the right gonadal vein. 
Urinary bladder demonstrates no asymmetric bladder wall thickening or calculi. 
Lesion posteriorly projecting from the uterus likely represents a fibroid. 
There is diverticular disease of the sigmoid colon. Very subtle stranding is 
suggested at the junction of the descending and sigmoid colon (image 91 of 
series 2). No evidence for bowel obstruction. No abnormal bowel wall thickening. 
Normal appendix right lower quadrant. 
Abdominal aorta is nonaneurysmal. The inferior vena cava is within normal 
limits. 
No mesenteric or retroperitoneal lymphadenopathy by size criteria. 
Abdominal wall is intact. Soft tissues appear normal. 
Osseous structures demonstrate no acute abnormality or aggressive appearing 
osseous lesion.
IMPRESSION: 1.  Very minimal subtle stranding involving the junction of the descending and 
sigmoid colon in a region of diverticula. Findings could represent very early 
mild uncomplicated diverticulitis. Reportedly this patient began antibiotics 
today. There is no abscess or free air to suggest complication. 
2.  Fibroid uterus. 
RADIATION DOSE REDUCTION: All CT scans are performed using radiation dose 
reduction techniques, when applicable.  Technical factors are evaluated and 
adjusted to ensure appropriate moderation of exposure.  Automated dose 
management technology is applied to adjust the radiation doses to minimize 
exposure while achieving diagnostic quality images.

## 2019-05-29 IMAGING — MG MAMMOGRAPHY SCREENING BILATERAL 3D TOMOSYNTHESIS WITH CAD
8 series · 8 of 24 positions shown · non-contrast
Comparison: Comparison was made to prior exams.

MAMMOGRAPHY SCREENING BILATERAL 3D TOMOSYNTHESIS WITH CAD, 05/29/2019 [DATE]: 
CLINICAL INDICATION: Screening exam.
TECHNIQUE: Digital bilateral mammograms and 3-D Tomosynthesis were obtained. 
These were interpreted both primarily and with the aid of computer-aided 
detection system.

[L MLO]
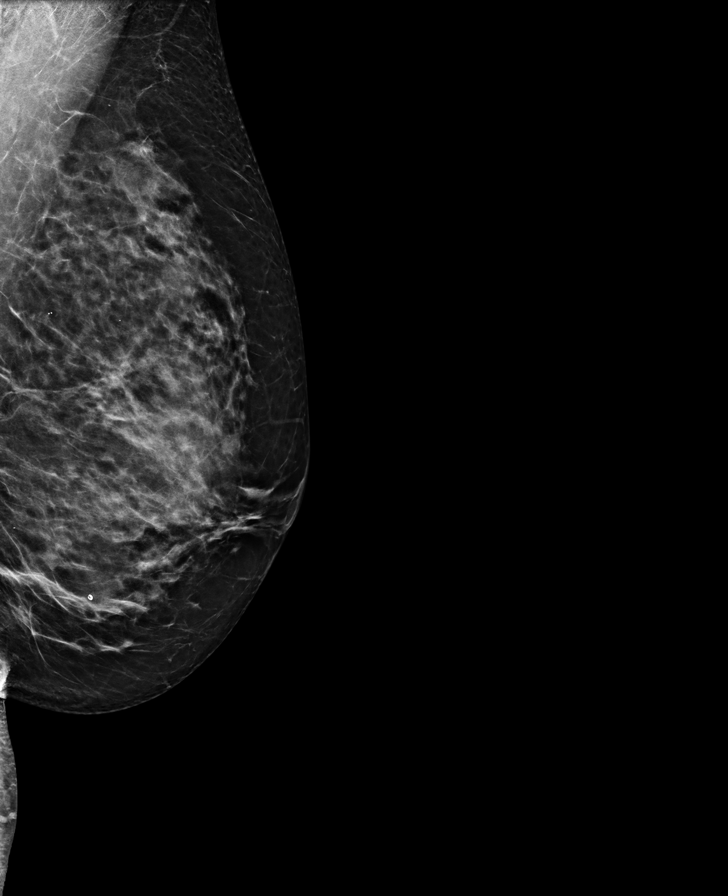

[R CC]
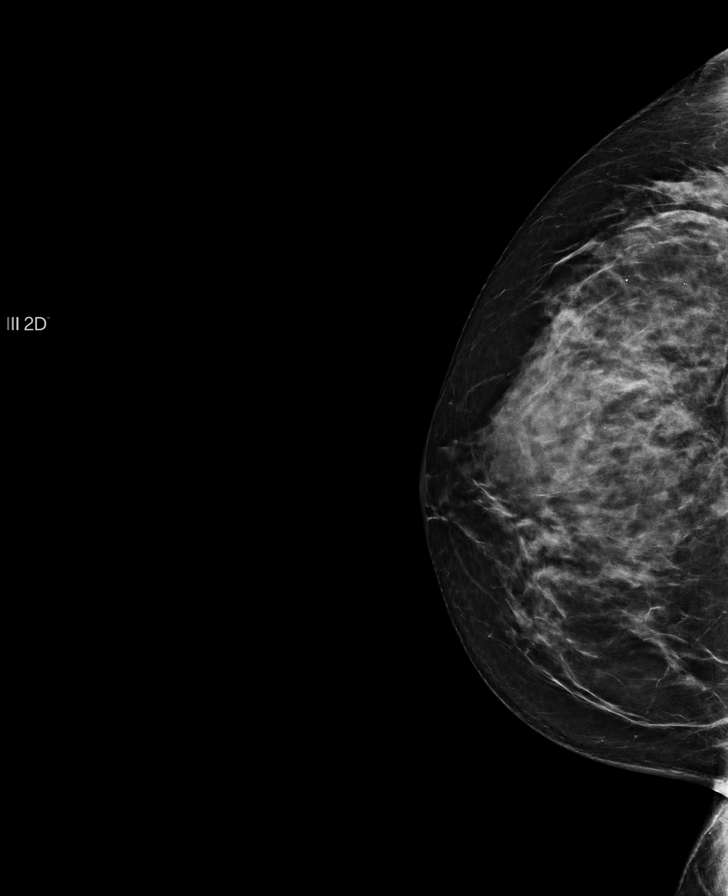

[R MLO]
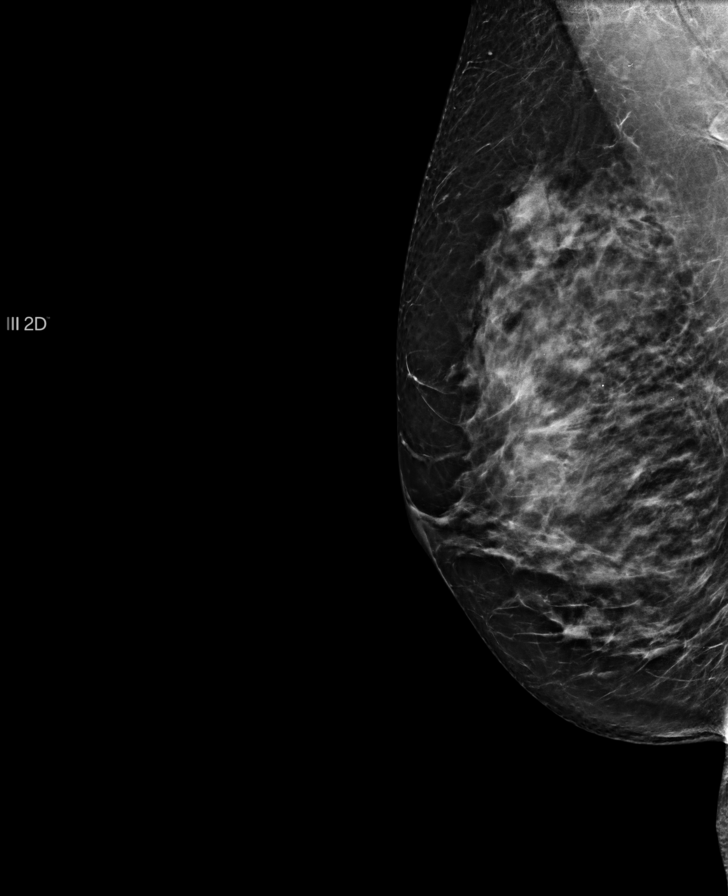

[L CC]
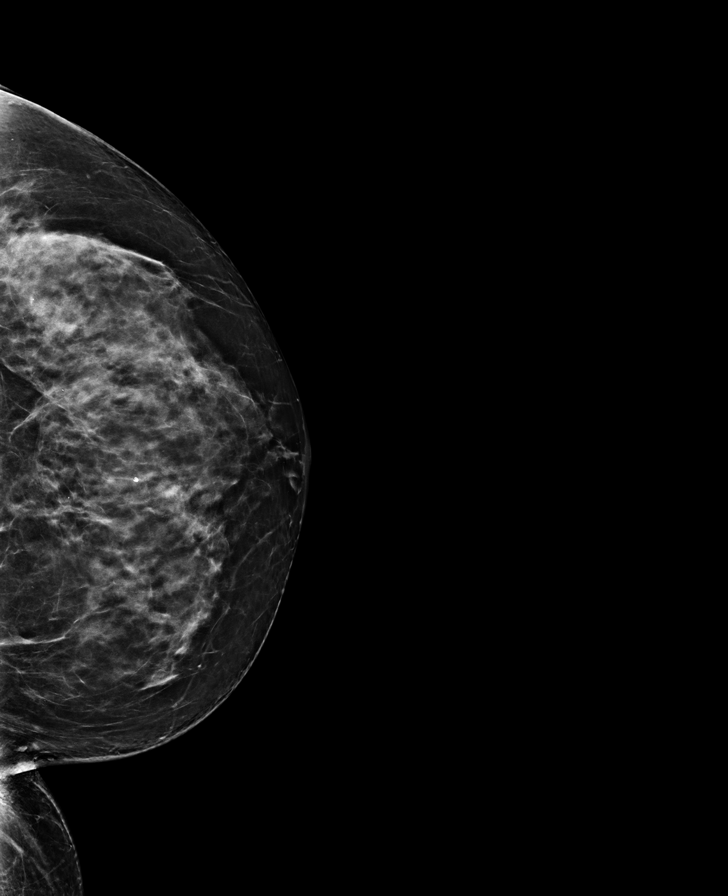

[L MLO tomo · tomo slice 39/77.0]
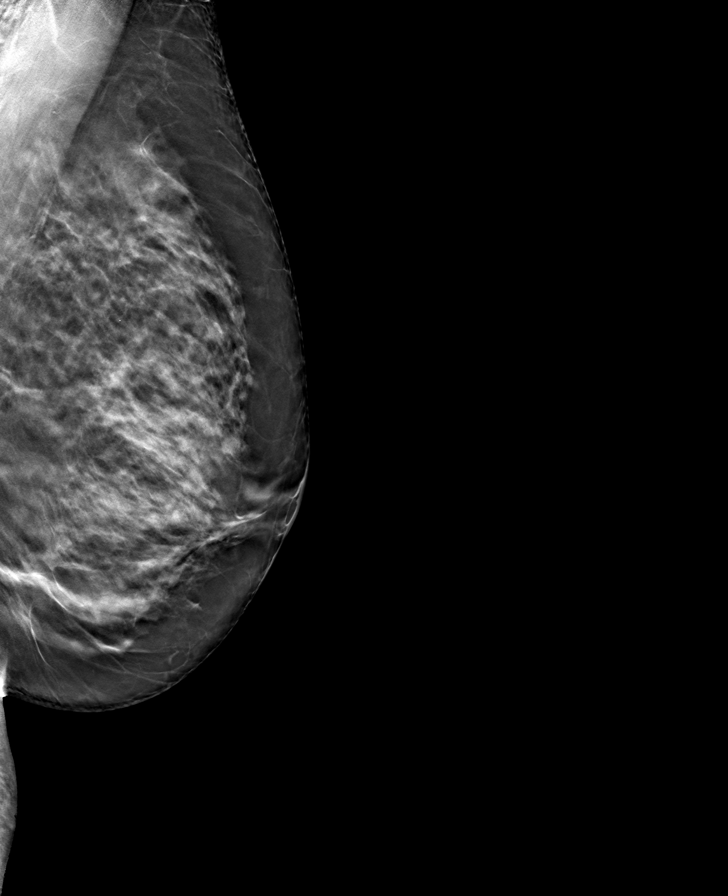

[R CC tomo · tomo slice 43/84.0]
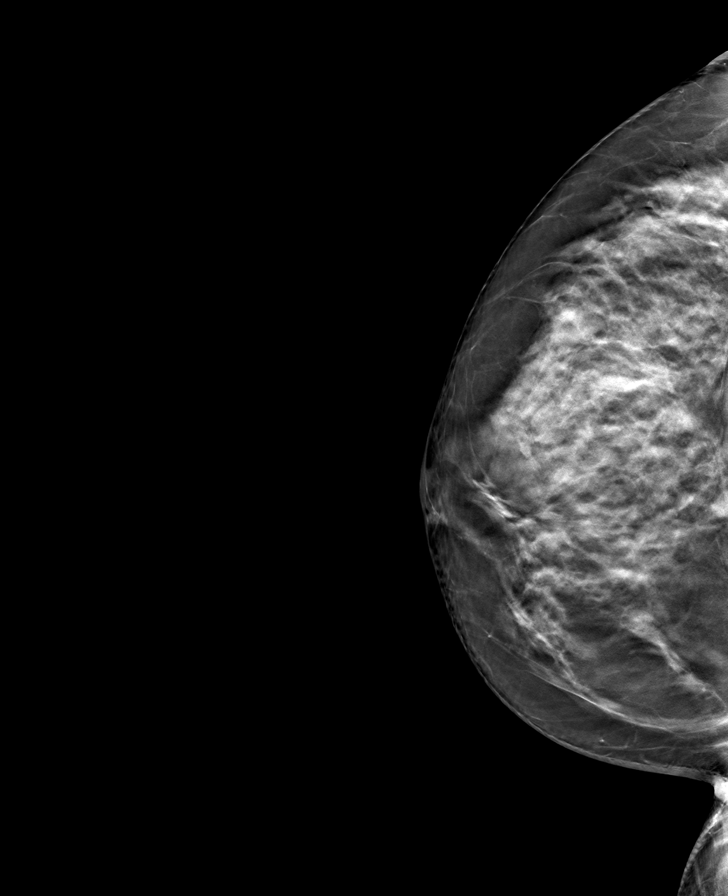

[R MLO tomo · tomo slice 39/76.0]
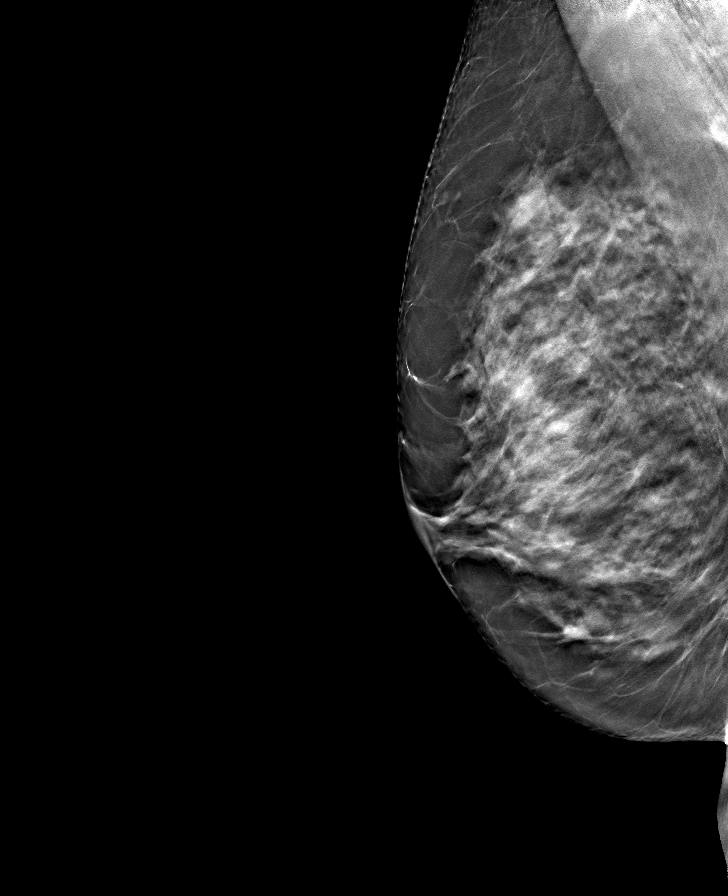

[L CC tomo · tomo slice 41/81.0]
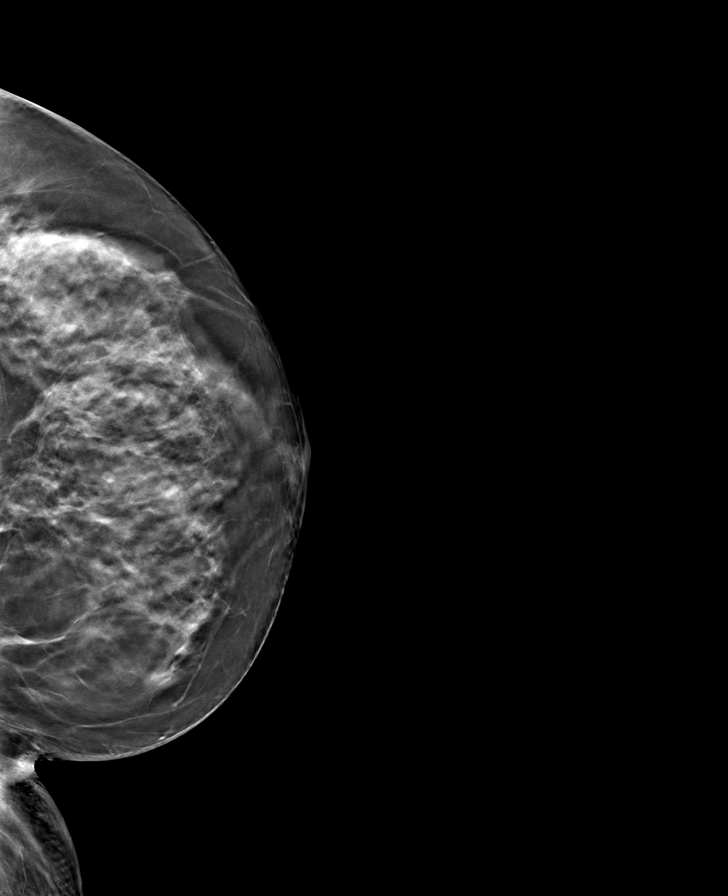

[8 of 24 positions shown; findings below may reference images not displayed]

BREAST DENSITY: (Level D) The breasts are extremely dense, which lowers the 
sensitivity of mammography.
FINDINGS: No suspicious mass, calcifications, or area of architectural 
distortion in either breast.
IMPRESSION: No mammographic evidence of malignancy in either breast. 
( BI-RADS 1) Negative mammogram. Routine mammographic follow-up is recommended.

## 2020-06-03 IMAGING — MG MAMMOGRAPHY SCREENING BILATERAL 3D TOMOSYNTHESIS WITH CAD
8 series · 8 of 24 positions shown · non-contrast
Comparison: Comparison was made to prior examinations.

MAMMOGRAPHY SCREENING BILATERAL 3D TOMOSYNTHESIS WITH CAD, 06/03/2020 [DATE]: 
CLINICAL INDICATION: Screening exam.
TECHNIQUE: Digital bilateral mammograms and 3-D Tomosynthesis were obtained. 
These were interpreted both primarily and with the aid of computer-aided 
detection system. 
BREAST DENSITY: (Level D) The breasts are extremely dense, which lowers the 
sensitivity of mammography.

[R CC]
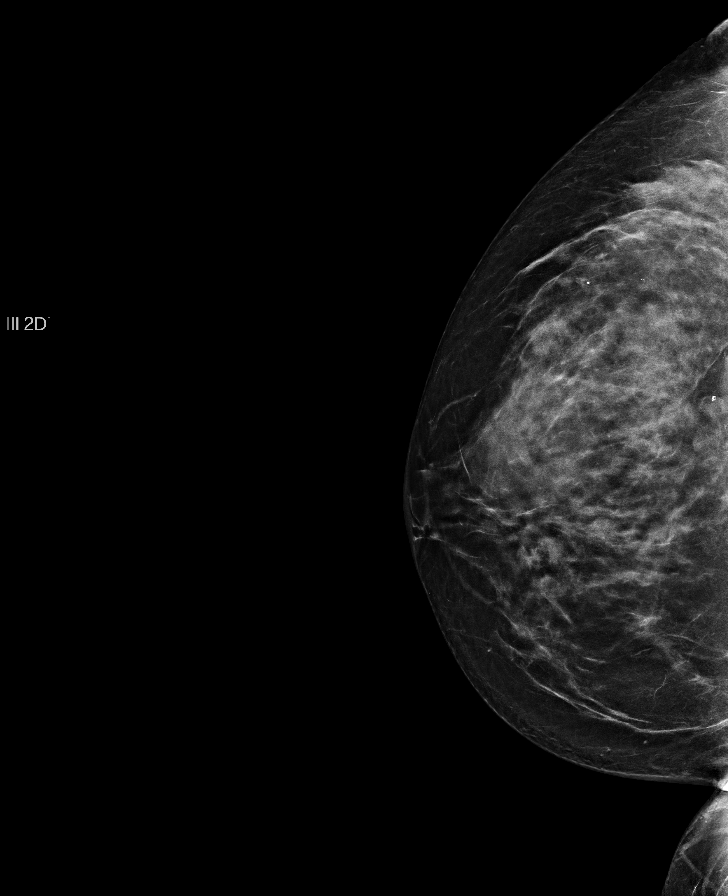

[L MLO]
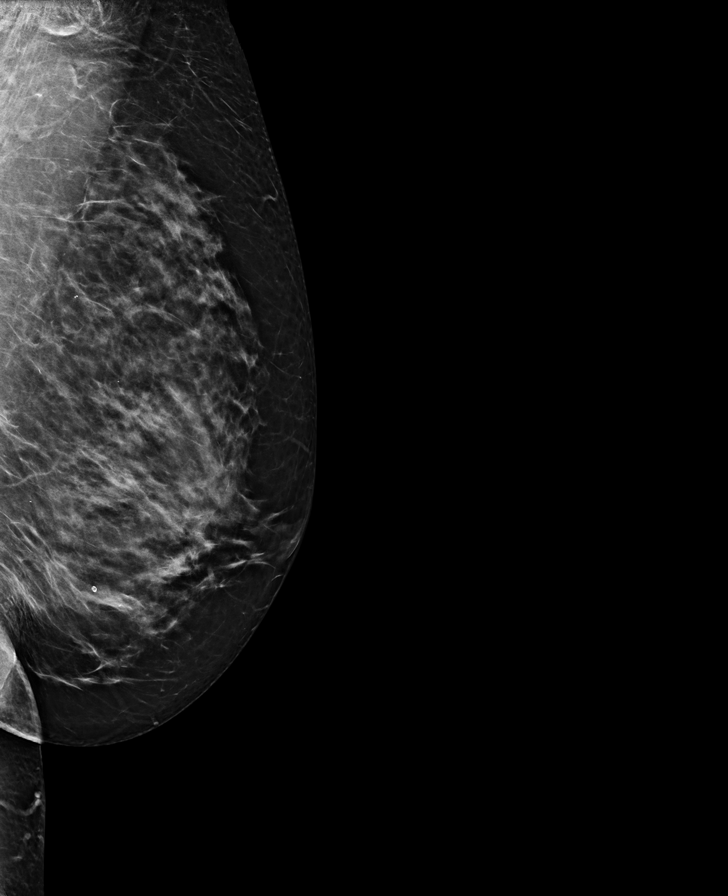

[R MLO]
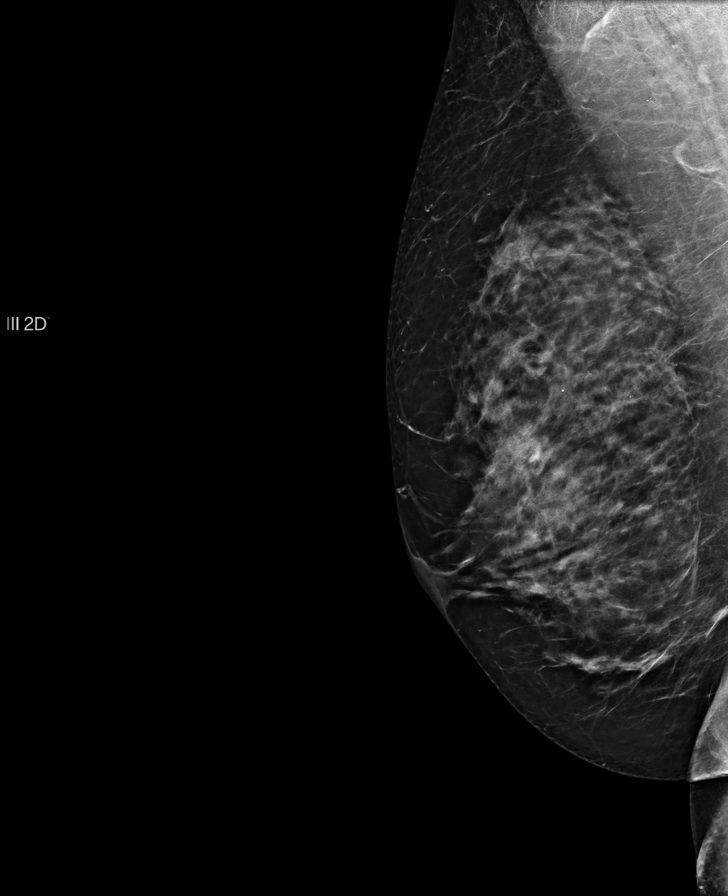

[L CC]
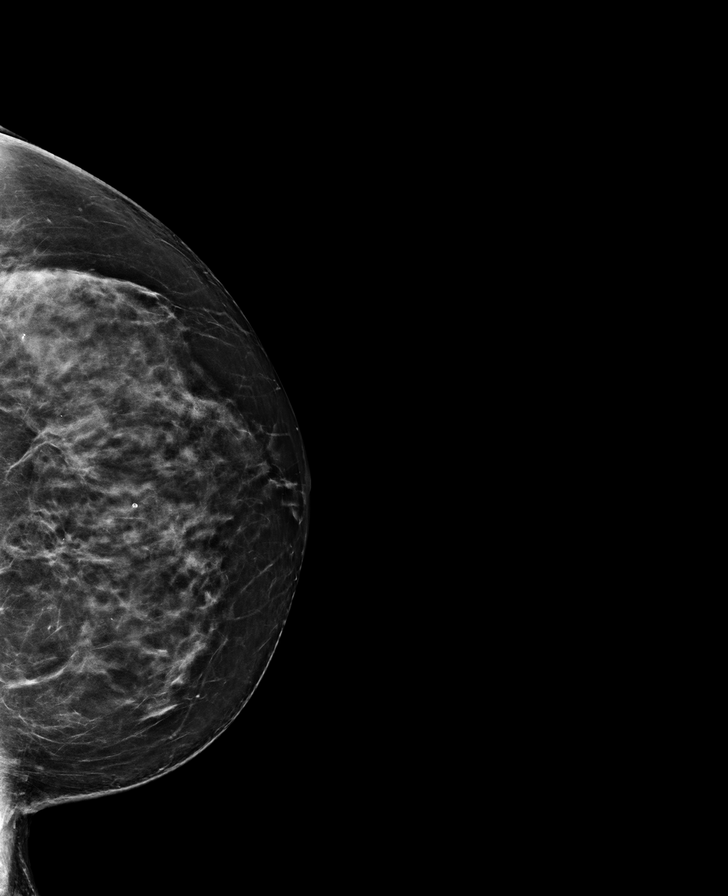

[R MLO tomo · tomo slice 44/87.0]
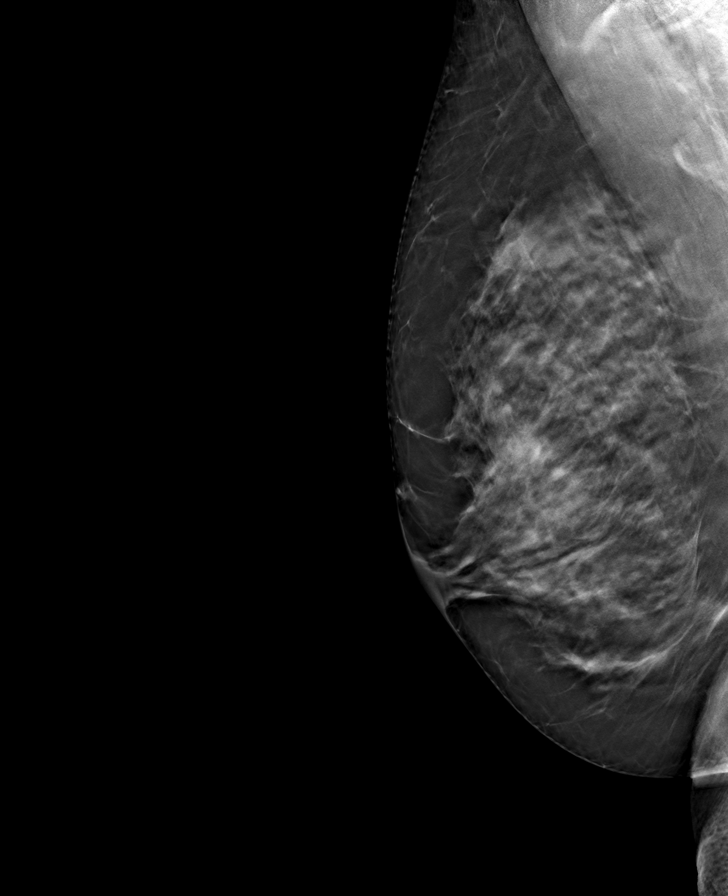

[L MLO tomo · tomo slice 44/87.0]
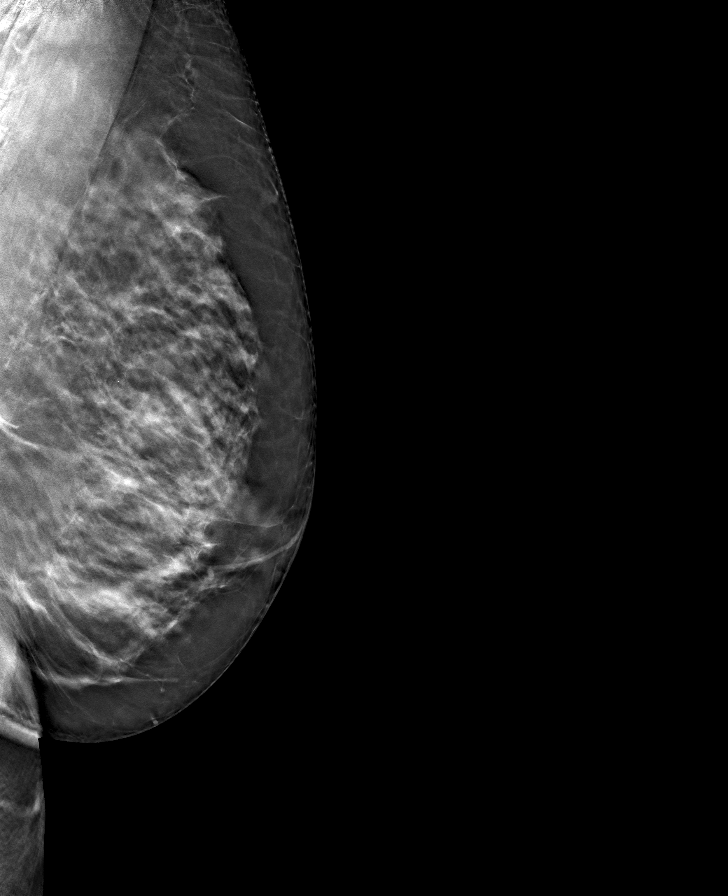

[R CC tomo · tomo slice 44/87.0]
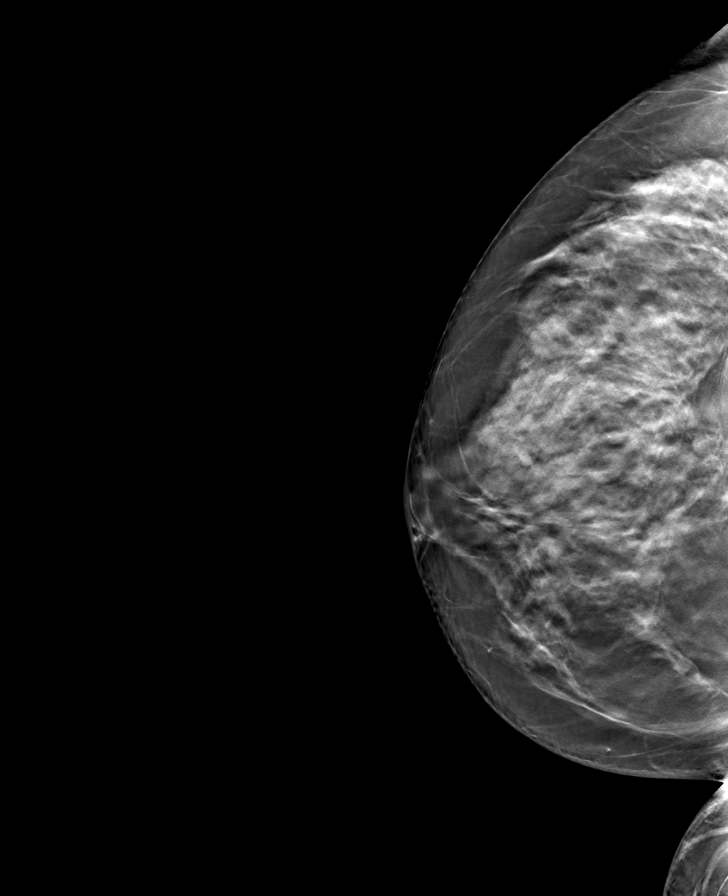

[L CC tomo · tomo slice 44/87.0]
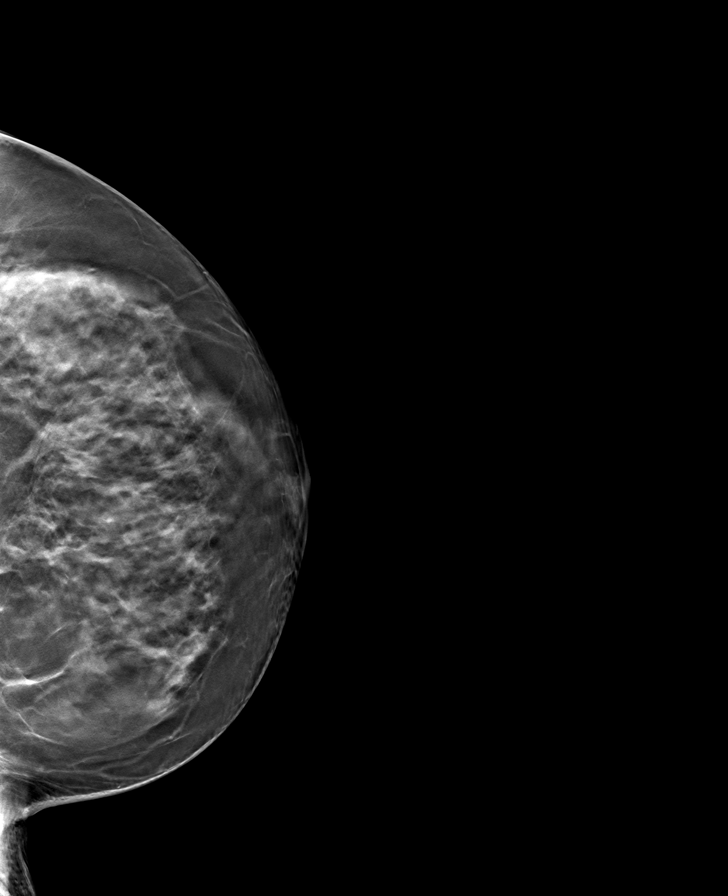

[8 of 24 positions shown; findings below may reference images not displayed]

FINDINGS: Overall stable mammographic appearance. Post biopsy tissue right 
breast. No mammographic findings suggestive for malignancy.
IMPRESSION: No mammographic evidence of malignancy in either breast. 
( BI-RADS 2) Benign findings. Routine mammographic follow-up is recommended.

## 2021-04-28 IMAGING — CT CT CALCIUM SCORING
1 series · 15 of 20 positions shown, 19 images · non-contrast
Comparison: There are no previous exams available for comparison.

________________________________________________________________________________________________ 
CT CALCIUM SCORING, 04/28/2021 [DATE]:
INDICATION: Evaluate coronary artery calcification.

[Series 2: cascoreseq 3.0 b35s 60% · axial · 0.42mm/px · z∈[-202,-80]mm · 15 of 91 slices shown, 19 images]
[im 5/91  vessel]
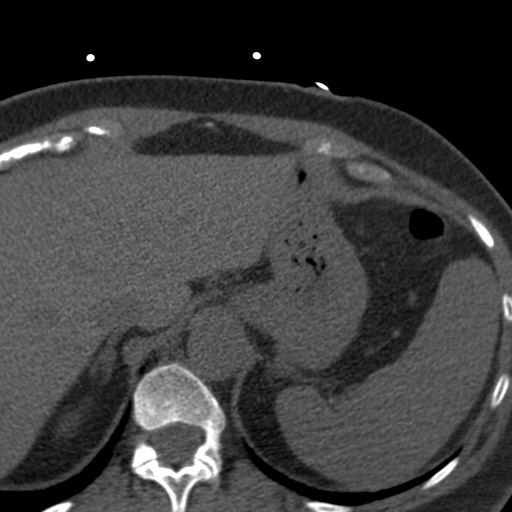
[im 5/91  lung]
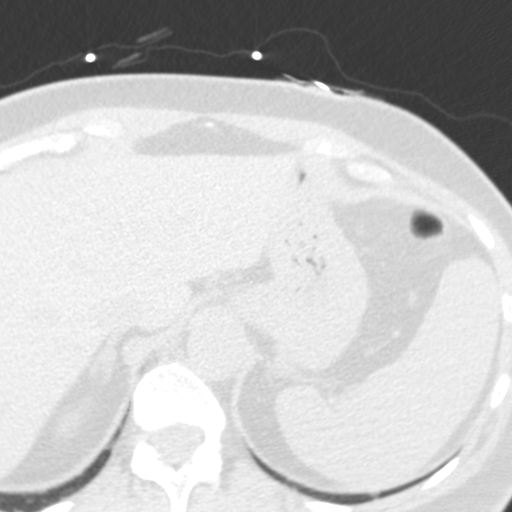
[im 10/91  vessel]
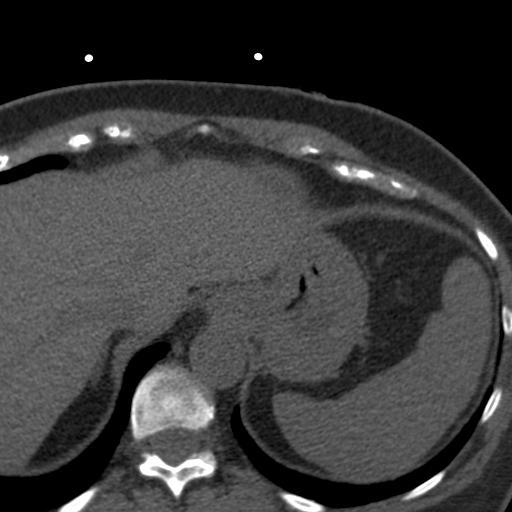
[im 19/91  vessel]
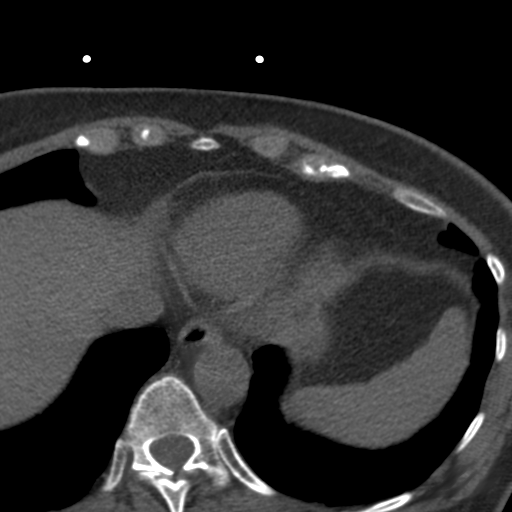
[im 24/91  vessel]
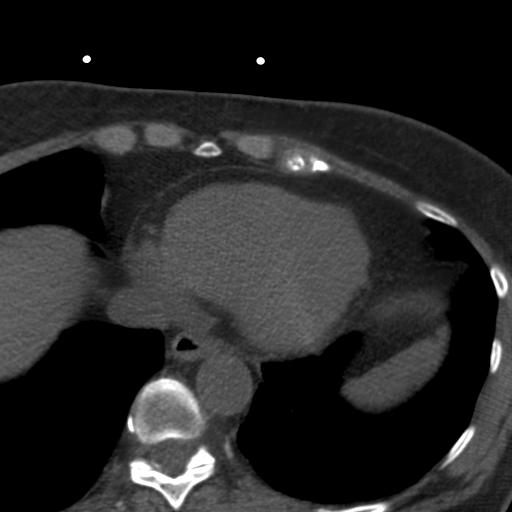
[im 29/91  vessel]
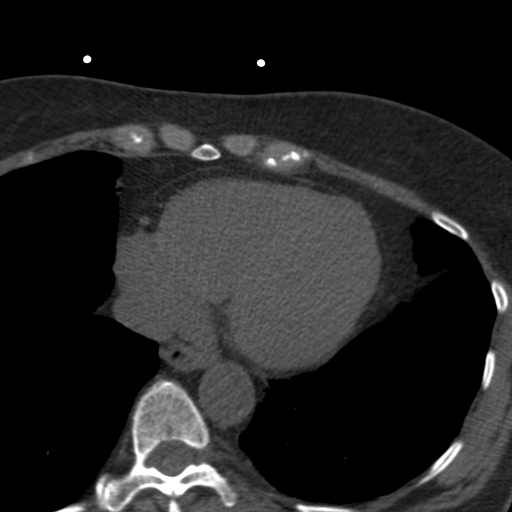
[im 29/91  lung]
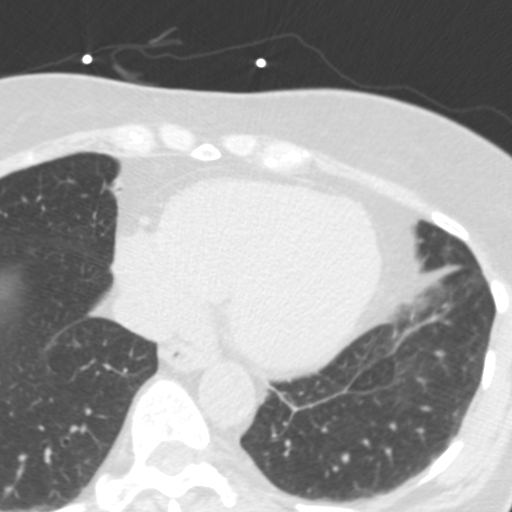
[im 34/91  vessel]
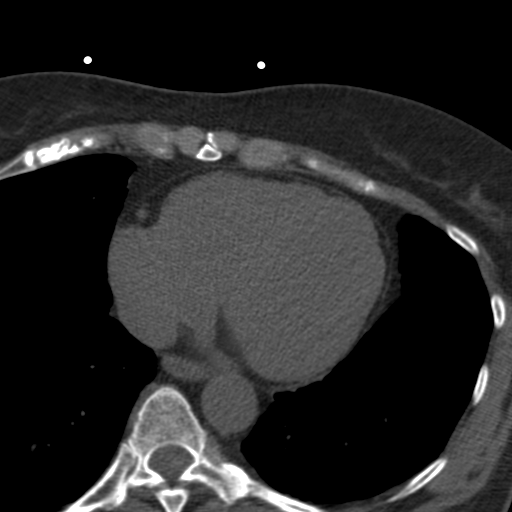
[im 38/91  vessel]
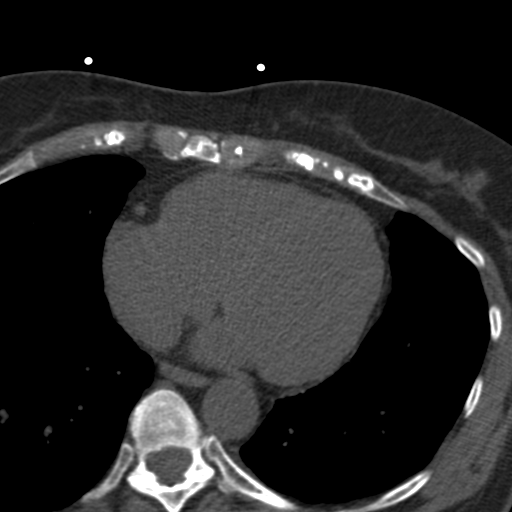
[im 48/91  vessel]
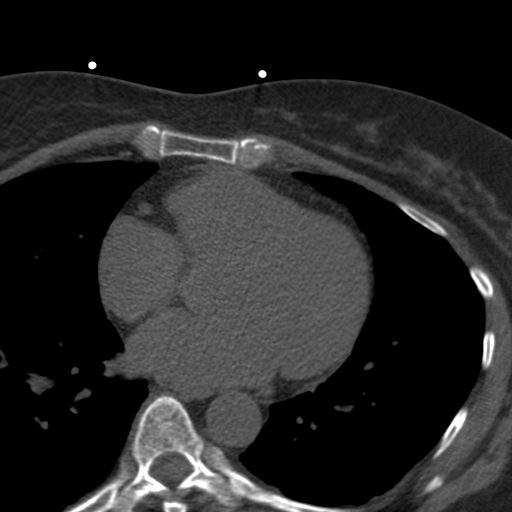
[im 53/91  vessel]
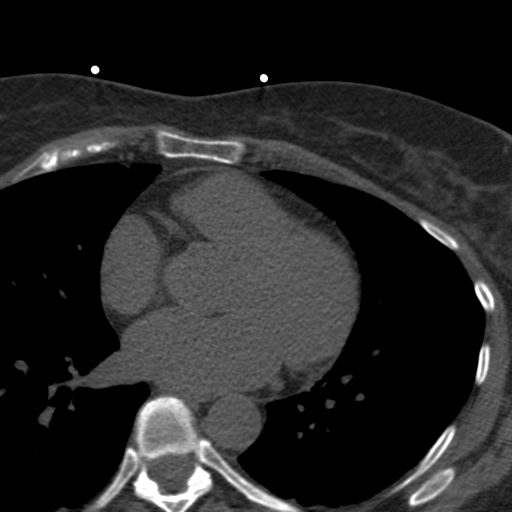
[im 53/91  lung]
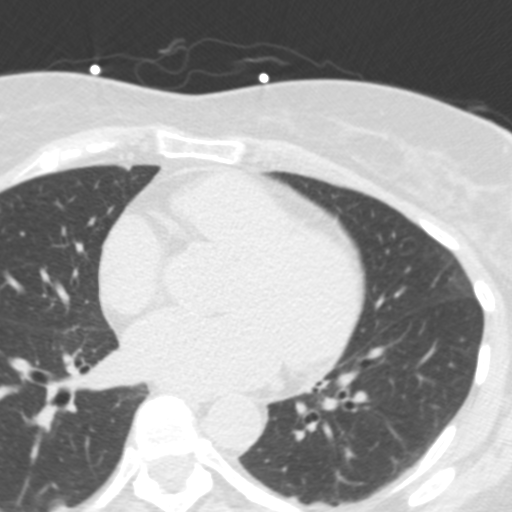
[im 57/91  vessel]
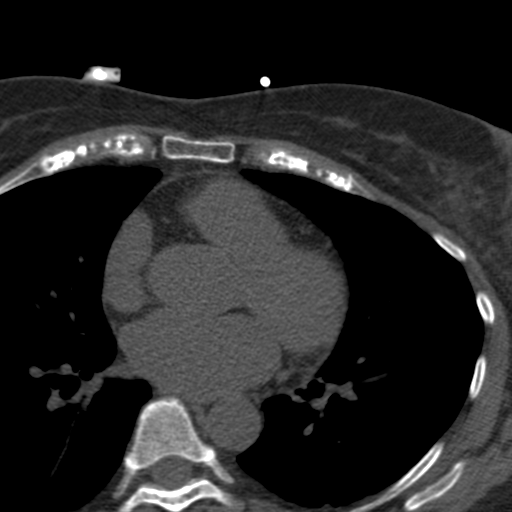
[im 62/91  vessel]
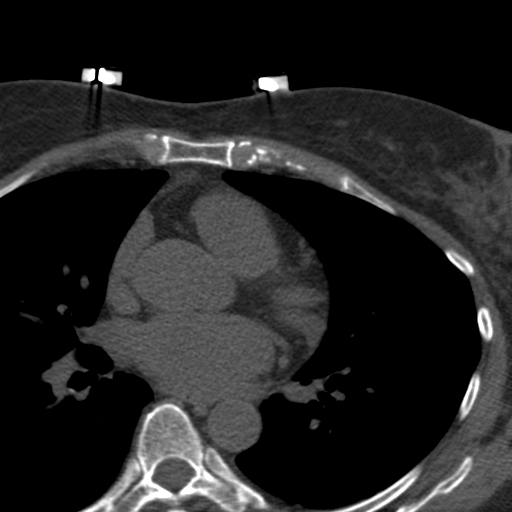
[im 67/91  vessel]
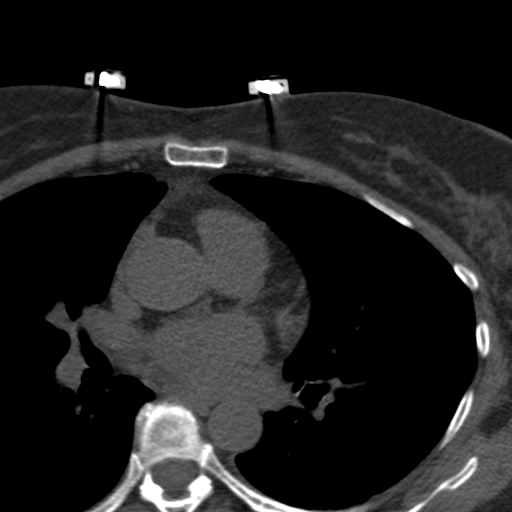
[im 76/91  vessel]
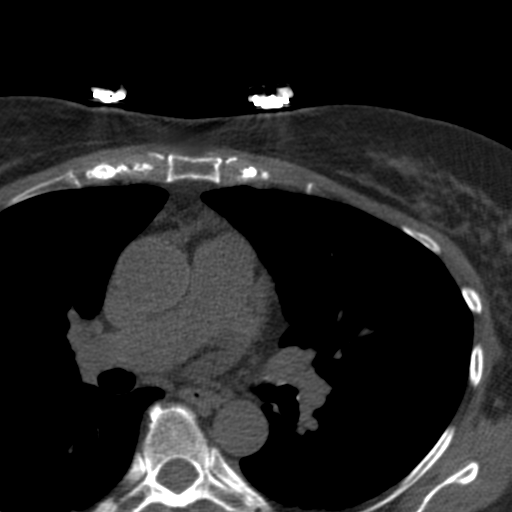
[im 76/91  lung]
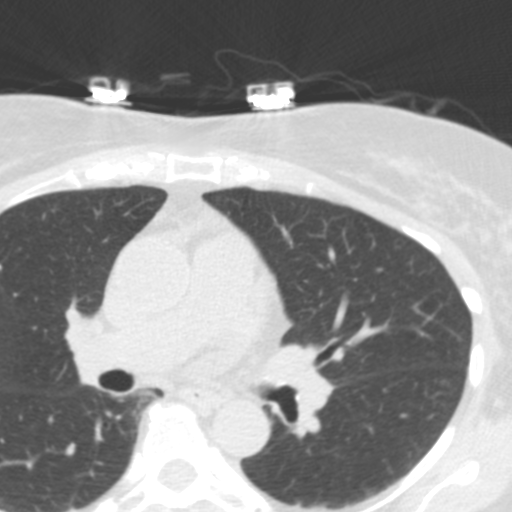
[im 81/91  vessel]
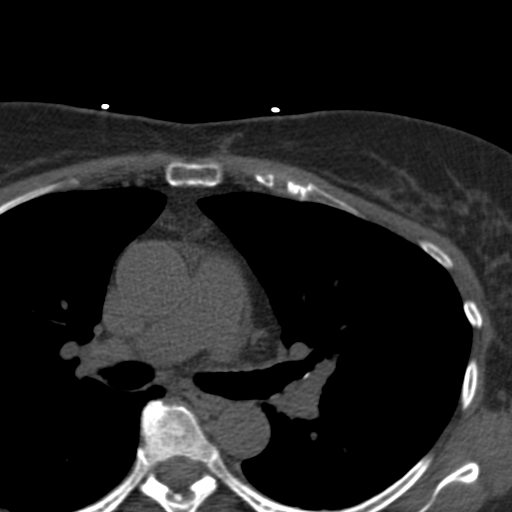
[im 86/91  vessel]
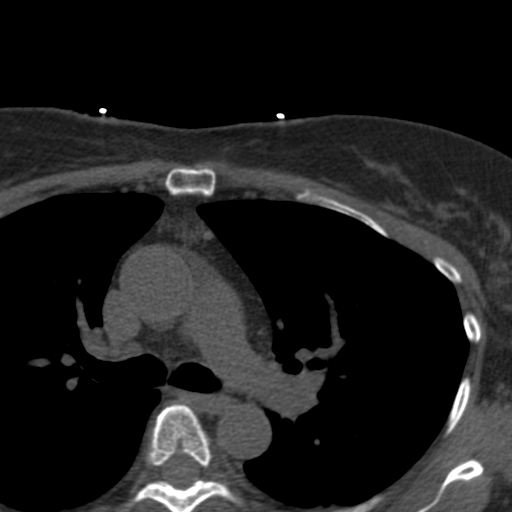

[15 of 20 positions shown; findings below may reference images not displayed]

FINDINGS: Visualized lungs are clear.
IMPRESSION: Total Calcium Score: 0 
No ancillary findings. 
Calcium score                                     Presence of Plaque 
0                                                    No evidence of plaque 
1-10                                               Minimal evidence of plaque 
11-100                                            Mild evidence of plaque 
101-400                                          Moderate evidence of plaque 
Over 400                                        Extensive evidence of plaque     

Calcium scoring sheets to follow. 
RADIATION DOSE REDUCTION: All CT scans are performed using radiation dose 
reduction techniques, when applicable.  Technical factors are evaluated and 
adjusted to ensure appropriate moderation of exposure.  Automated dose 
management technology is applied to adjust the radiation doses to minimize 
exposure while achieving diagnostic quality images.

## 2021-07-28 IMAGING — MG MAMMOGRAPHY SCREENING BILATERAL 3[PERSON_NAME]
8 series · 8 of 24 positions shown · non-contrast
Comparison: 06/03/2020 and dating back to 05/21/2015

________________________________________________________________________________________________ 
MAMMOGRAPHY SCREENING BILATERAL 3KIKIONE VALERIEVA, 07/28/2021 [DATE]: 
CLINICAL INDICATION: Screening. History of prior negative bilateral breast 
biopsies.
TECHNIQUE: Digital bilateral mammograms and 3-D Tomosynthesis were obtained. 
These were interpreted both primarily and with the aid of computer-aided 
detection system.  
BREAST DENSITY: (Level D) The breasts are extremely dense, which lowers the 
sensitivity of mammography.

[L CC]
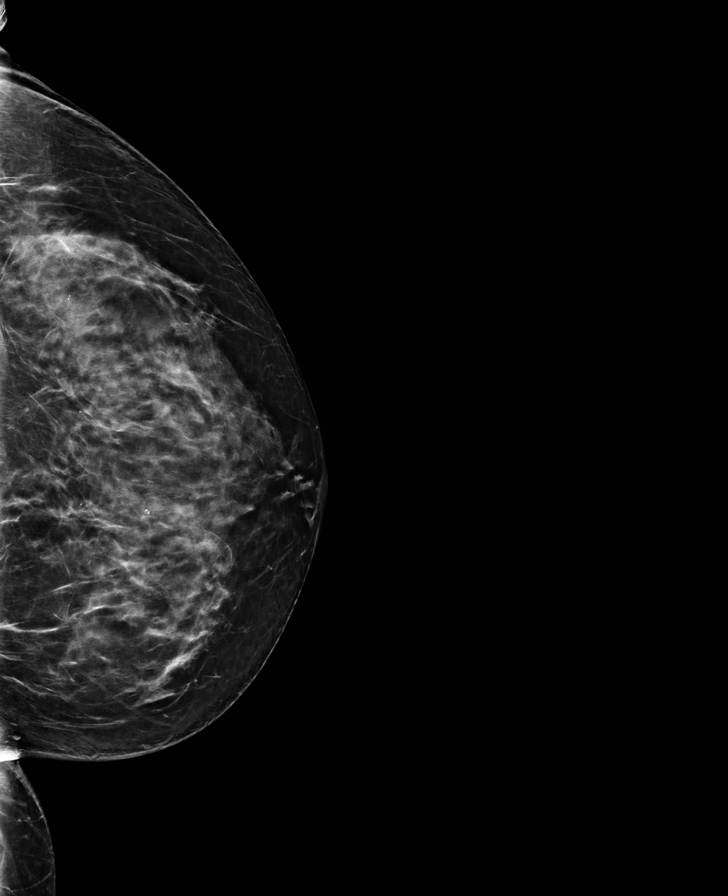

[L MLO]
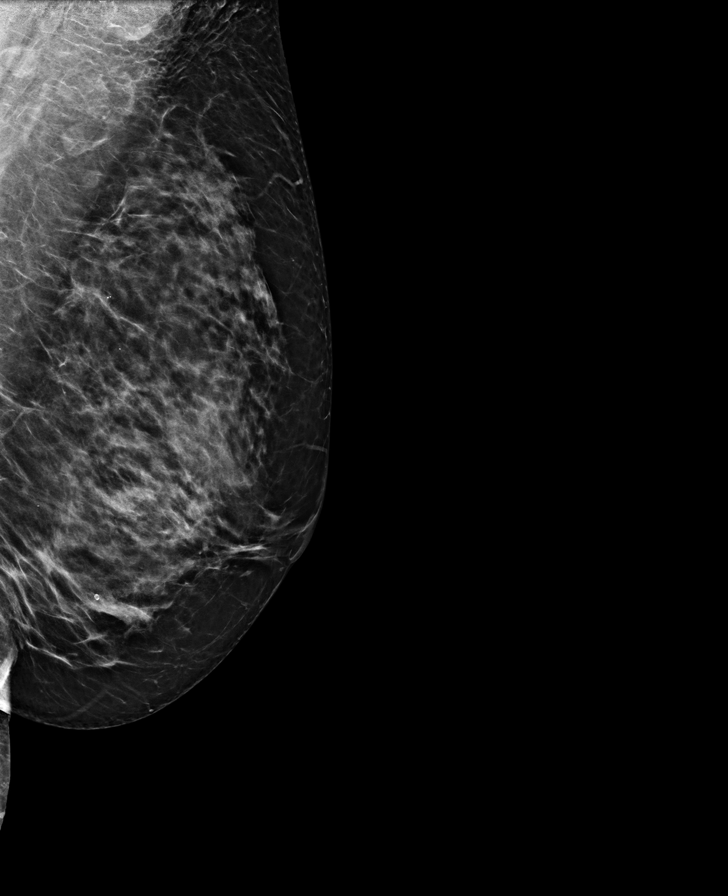

[R MLO]
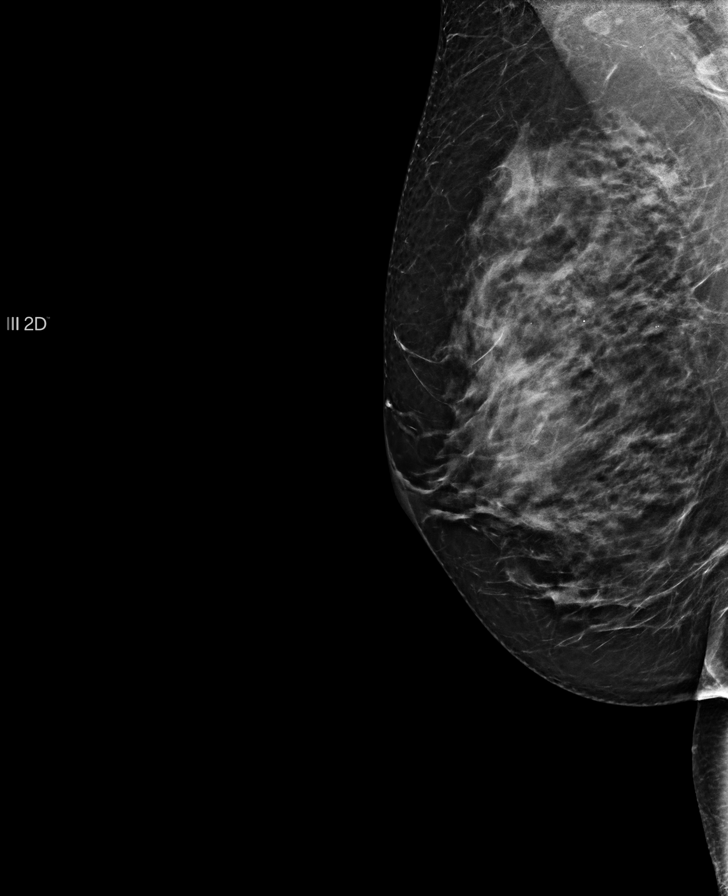

[R CC]
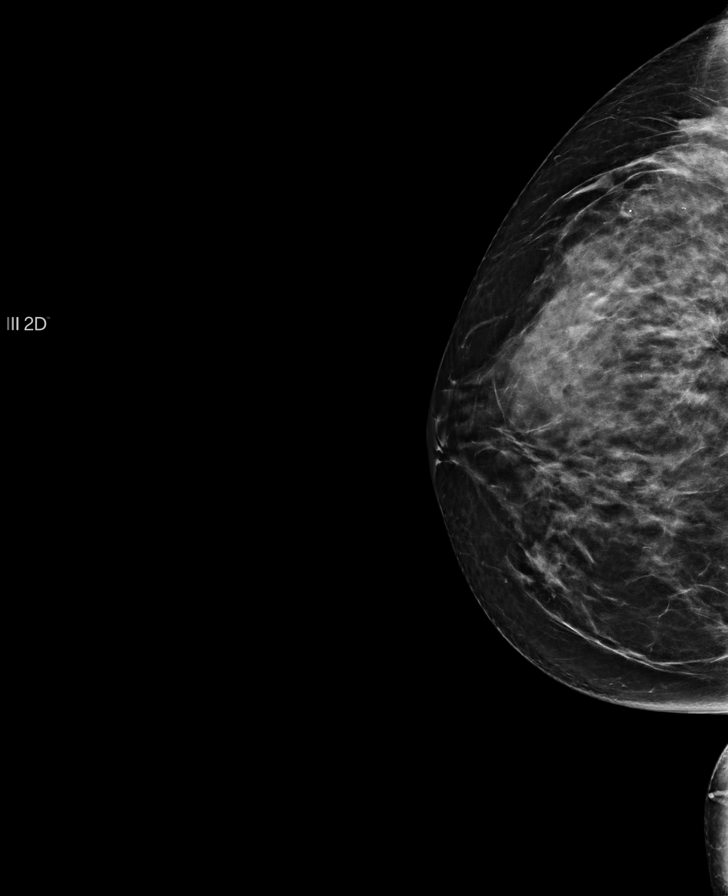

[R CC tomo · tomo slice 41/81.0]
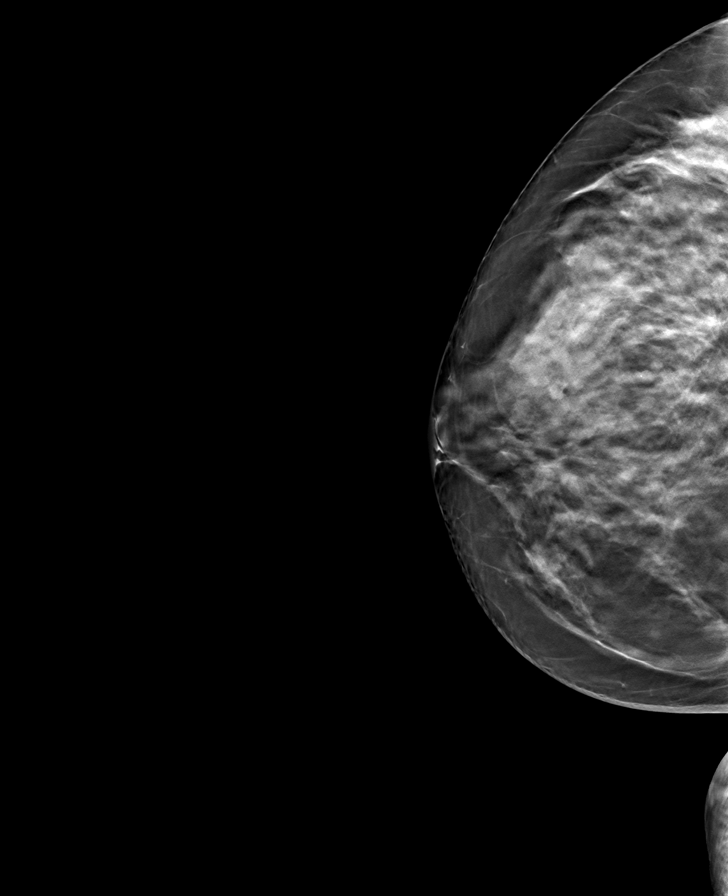

[R MLO tomo · tomo slice 39/77.0]
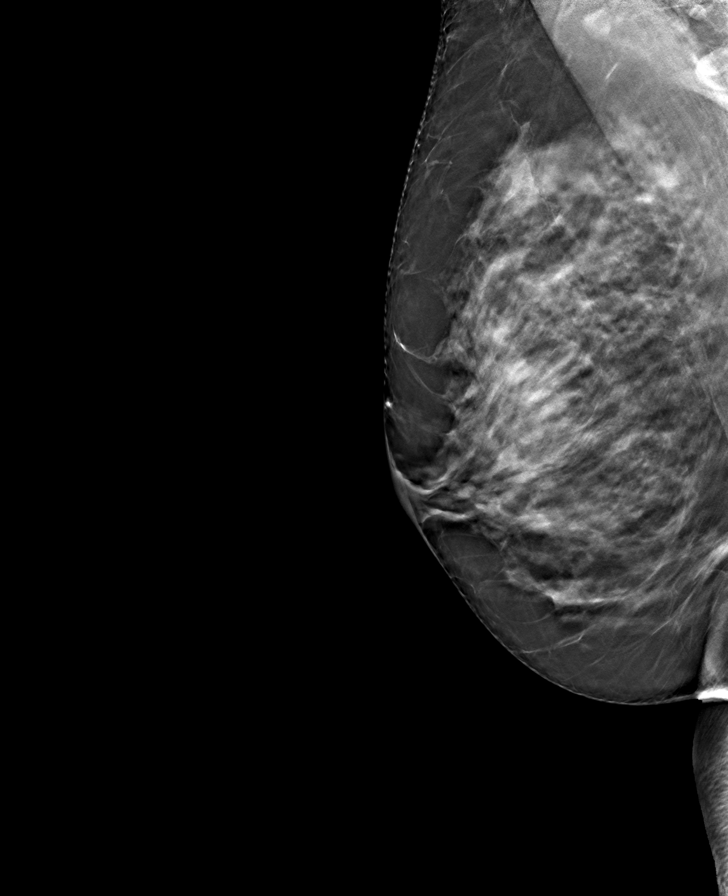

[L CC tomo · tomo slice 42/83.0]
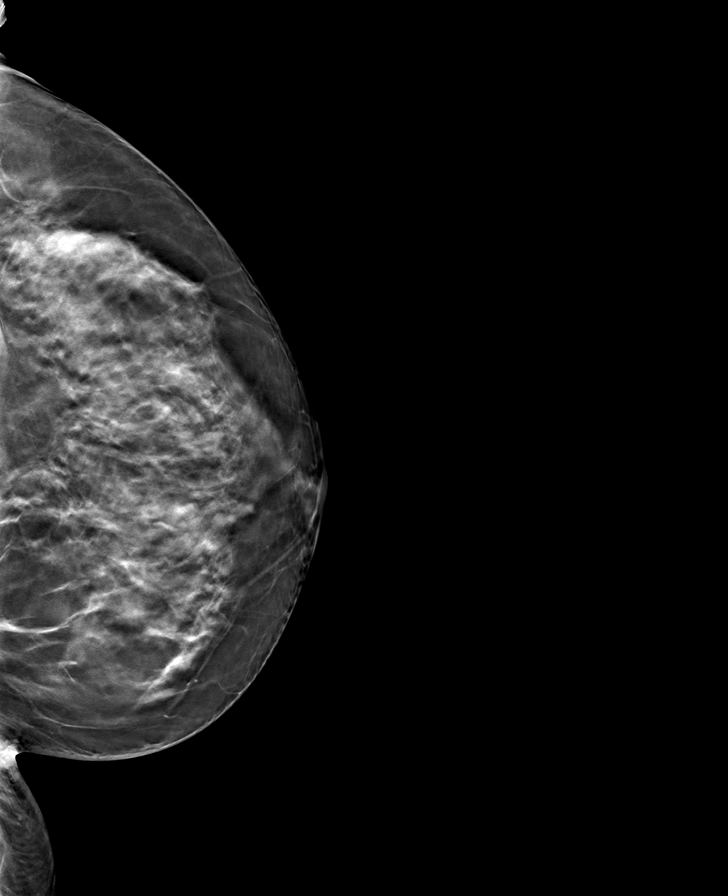

[L MLO tomo · tomo slice 41/80.0]
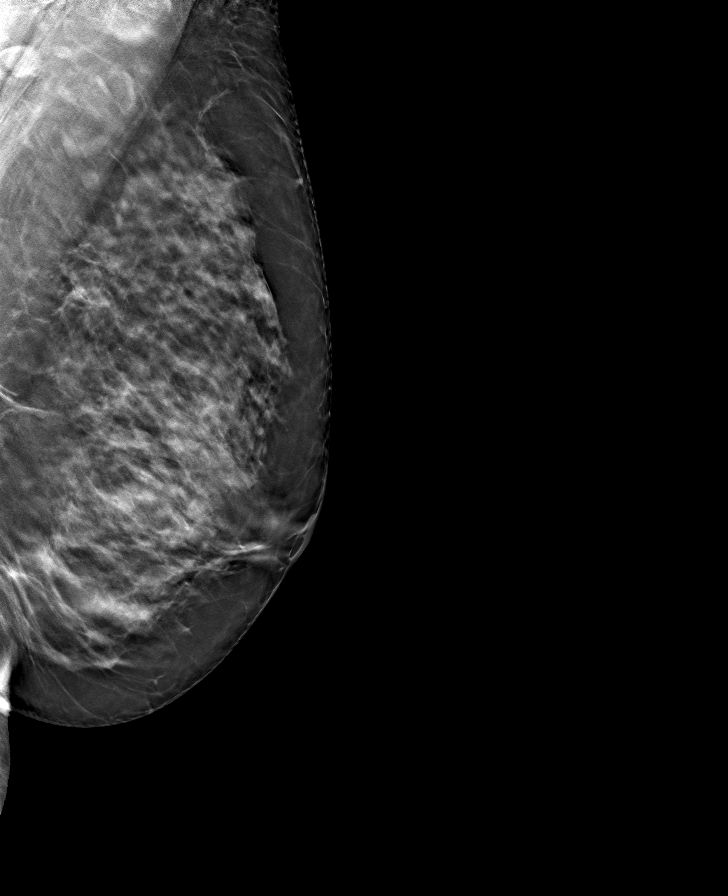

[8 of 24 positions shown; findings below may reference images not displayed]

FINDINGS: No suspicious mass, calcifications, or area of architectural 
distortion in either breast. Overall stable mammographic appearance.
IMPRESSION: No mammographic findings suggestive for malignancy. 
(BI-RADS 2) Benign findings. Routine mammographic follow-up is recommended.

## 2022-04-15 IMAGING — MR MRI ABDOMEN W/WO CONTRAST WITH MRCP
15 of 24 series · 26 of 48 positions shown · IV contrast (gadolinium)
Comparison: CT scan from September 2018

________________________________________________________________________________________________ 
MRI ABDOMEN W/WO CONTRAST WITH MRCP, 04/15/2022 [DATE]: 
CLINICAL INDICATION: Chronic pancreatitis. Diverticulitis.
TECHNIQUE: Multiplanar, multi acquisition MR images of the abdomen were 
performed without and with intravenous gadolinium enhancement including dynamic 
imaging.  3D MRCP sequences were performed with post processing.  5.5 mL of 
Gadavist were injected intravenously by hand. 2.0 mL of Gadavist were discarded. 
 Patient was scanned on a 3T magnet.

[Series 201: survey navi · axial · 15.0mm · 1.45mm/px · z∈[+76,+190]mm · 2 of 11 slices shown]
[im 1/11]
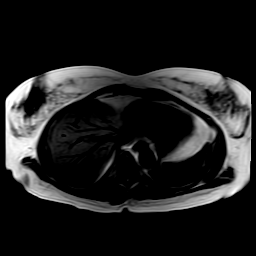
[im 11/11]
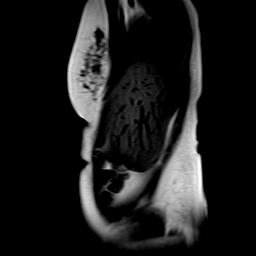

[Series 301: T2 · coronal · 5.0mm · 0.43mm/px · 2 of 30 slices shown (1 of 2)]
[im 1/30]
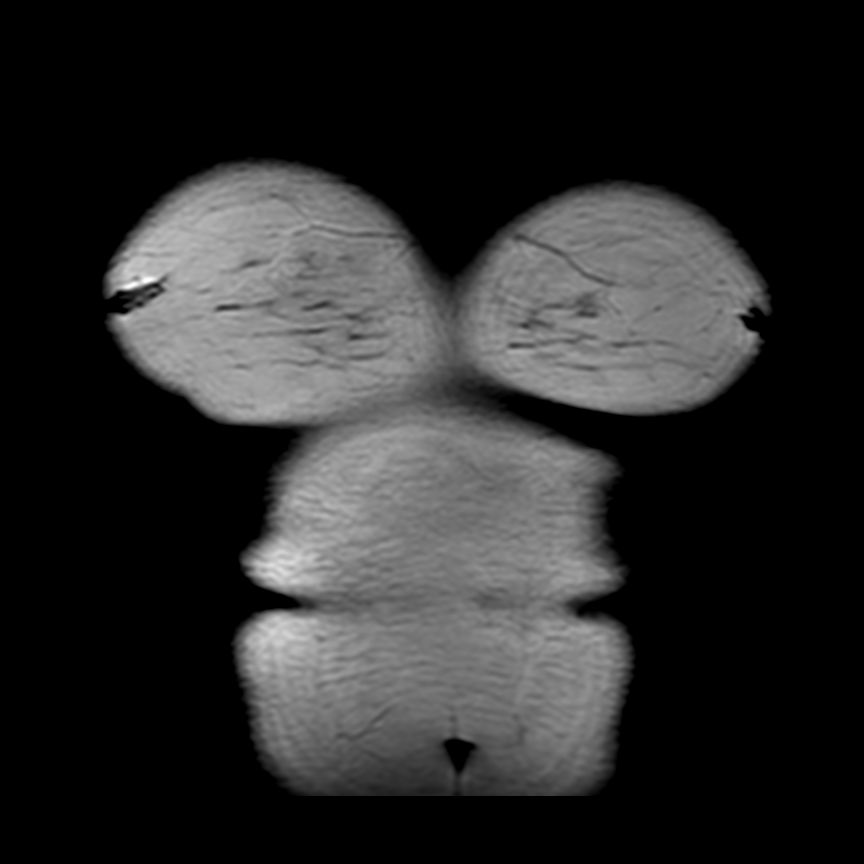
[im 30/30]
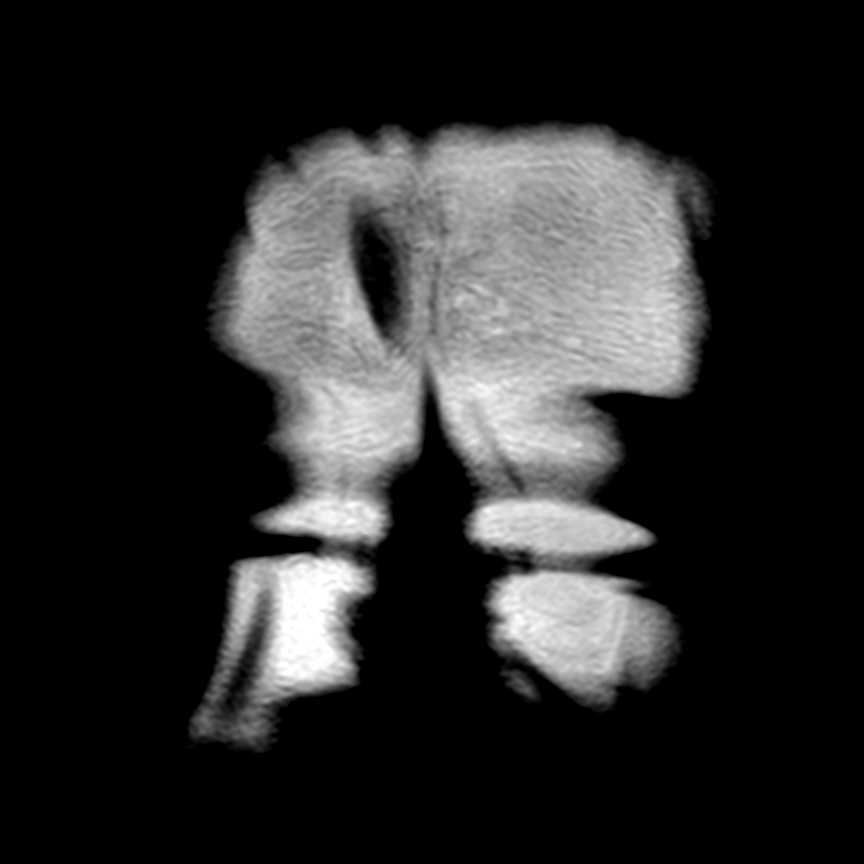

[Series 402: sout of phase · axial · 5.5mm · 1.03mm/px · z∈[-69,+132]mm · 2 of 32 slices shown]
[im 1/32]
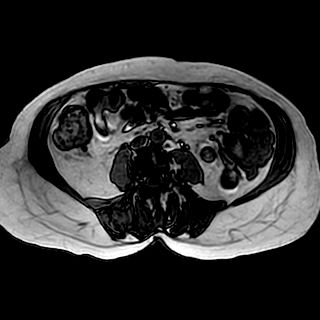
[im 32/32]
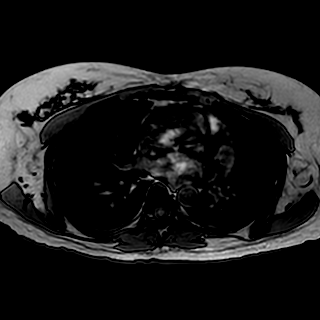

[Series 403: sin phase · axial · 5.5mm · 1.03mm/px · 1 of 32 slices shown]
[im 1/32]
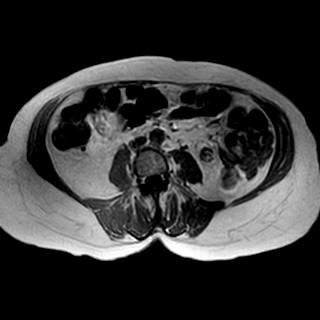

[Series 501: T2 · axial · 5.0mm · 0.74mm/px · 1 of 35 slices shown (2 of 2)]
[im 1/35]
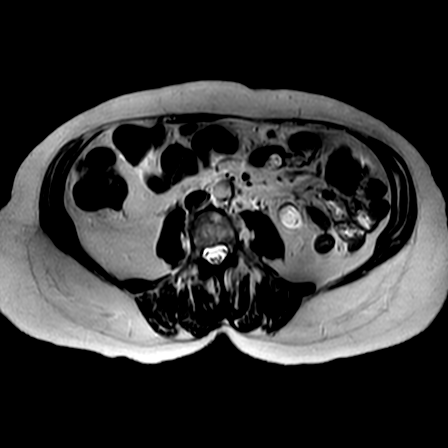

[Series 601: T2 fat-sat · axial · 3.2mm · 0.74mm/px · 1 of 38 slices shown]
[im 1/38]
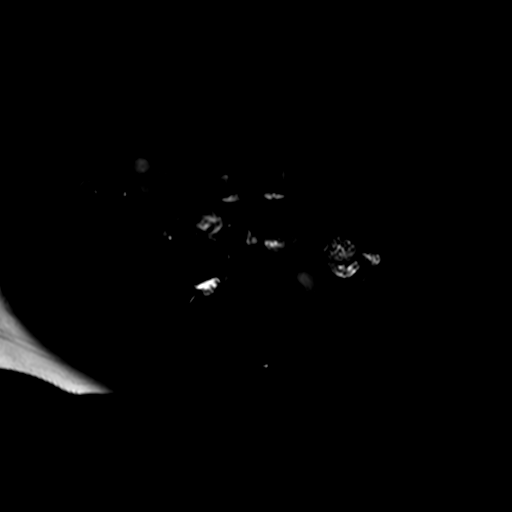

[Series 801: ssh_mrcprad · coronal · 40.0mm · 0.59mm/px · 1 of 6 slices shown]
[im 1/6]
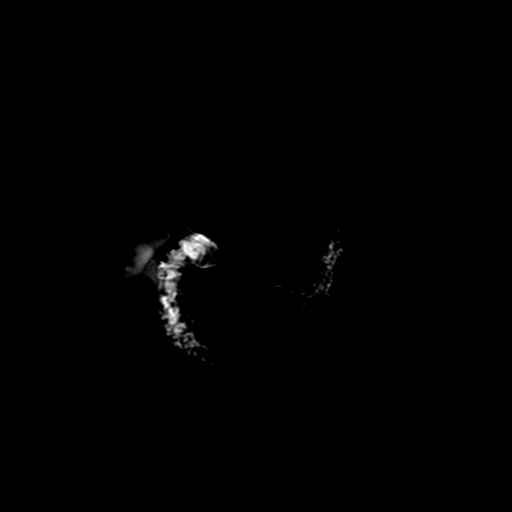

[Series 1101: DWI · axial · 5.0mm · 0.94mm/px · z∈[-66,+148]mm · 2 of 80 slices shown]
[im 1/80]
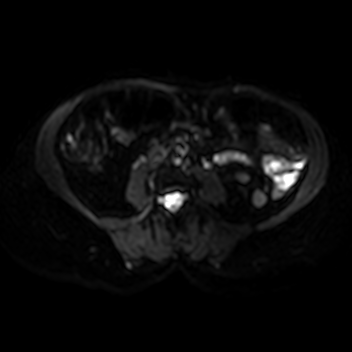
[im 80/80]
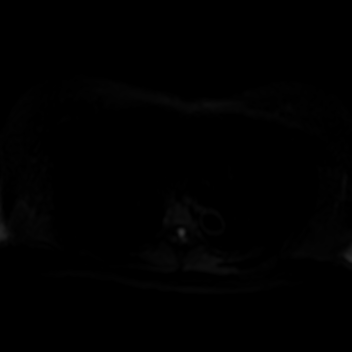

[Series 1102: dadc 600 · axial · 5.0mm · 0.94mm/px · 1 of 40 slices shown]
[im 1/40]
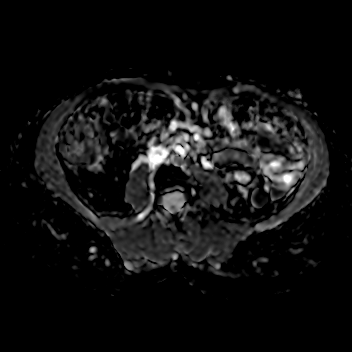

[Series 1103: sb0 · axial · 5.0mm · 0.94mm/px · 1 of 40 slices shown]
[im 1/40]
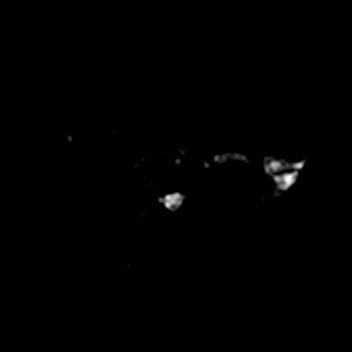

[Series 1104: (id) · axial · 5.0mm · 0.94mm/px · 1 of 40 slices shown]
[im 1/40]
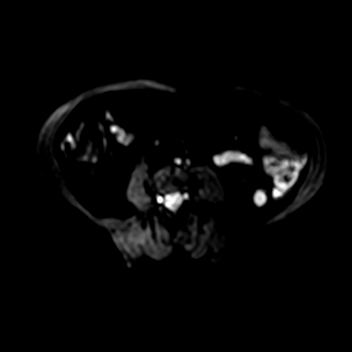

[Series 1202: DIXON · axial · 3.5mm · 0.82mm/px · z∈[-72,+132]mm · 3 of 118 slices shown (1 of 4)]
[im 1/118]
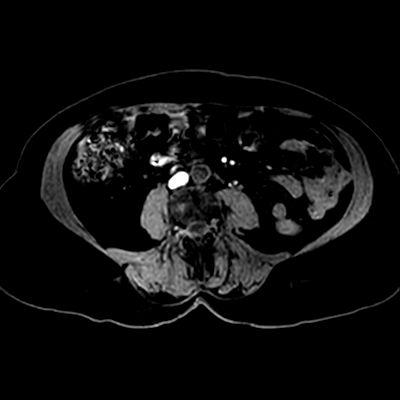
[im 59/118]
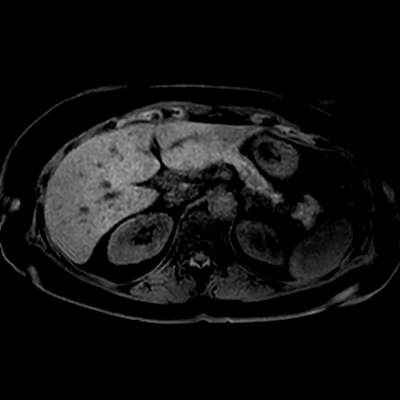
[im 118/118]
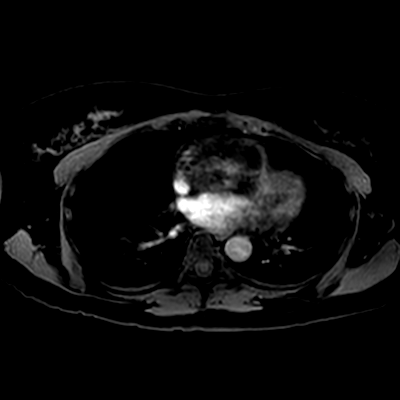

[Series 1203: DIXON · axial · 3.5mm · 0.82mm/px · z∈[-72,+132]mm · 3 of 118 slices shown (2 of 4)]
[im 1/118]
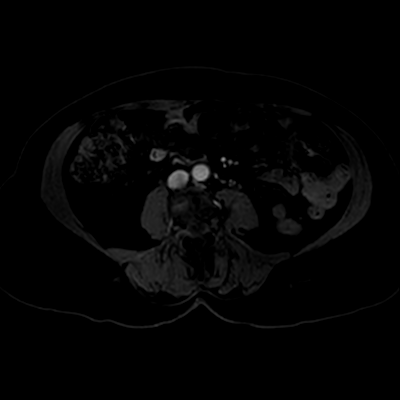
[im 59/118]
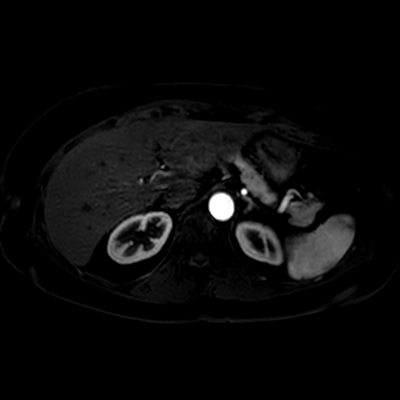
[im 118/118]
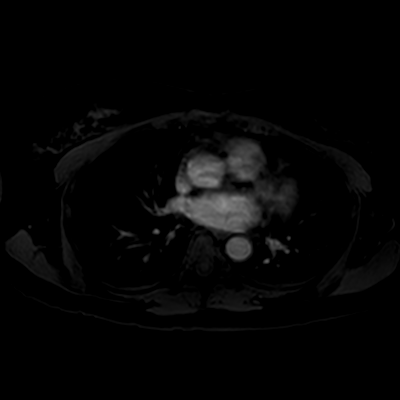

[Series 1204: DIXON · axial · 3.5mm · 0.82mm/px · z∈[-72,+132]mm · 3 of 118 slices shown (3 of 4)]
[im 1/118]
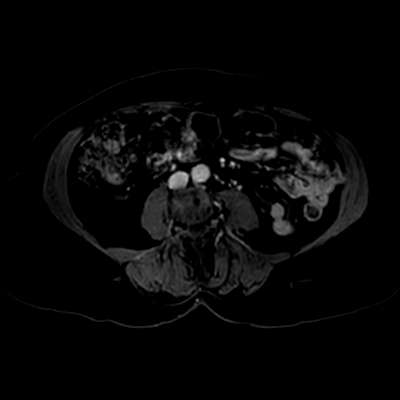
[im 59/118]
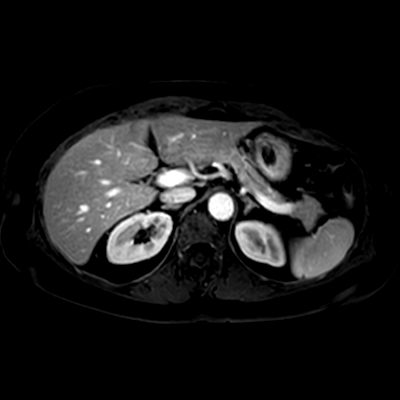
[im 118/118]
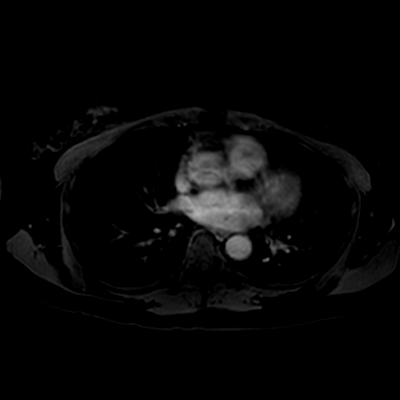

[Series 1205: DIXON · axial · 3.5mm · 0.82mm/px · z∈[-72,+29]mm · 2 of 118 slices shown (4 of 4)]
[im 1/118]
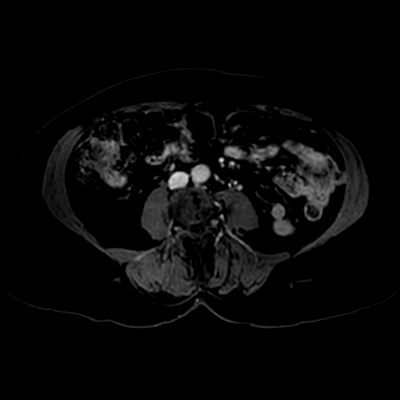
[im 59/118]
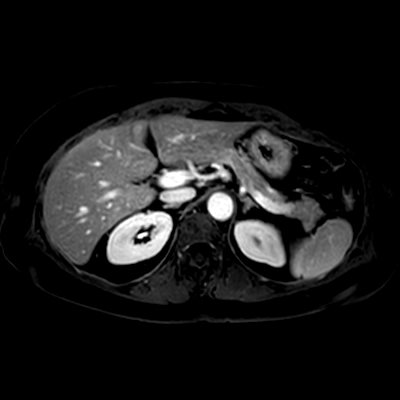

[26 of 48 positions shown; findings below may reference images not displayed]

FINDINGS: The liver, spleen, pancreas and adrenal glands are normal in 
appearance. Specifically, no pancreatic mass or pancreatic ductal dilatation on 
the MRCP images. No abnormal enhancement. There are small cysts scattered 
throughout the kidneys bilaterally. No lymphadenopathy identified. Gallbladder 
is normal in appearance. There are degenerative changes seen within the spine.
IMPRESSION: Normal exam with small renal cyst and mild degenerative changes.

## 2022-08-01 IMAGING — MG MAMMOGRAPHY DIAGNOSTIC BILATERAL 3[PERSON_NAME]
8 series · 8 of 24 positions shown · non-contrast
Comparison: Comparison was made to prior examinations.

________________________________________________________________________________________________ 
MAMMOGRAPHY DIAGNOSTIC BILATERAL 3KIRI JIM, BREAST ULTRASOUND BILATERAL COMPLETE, 
08/01/2022 [DATE]: 
CLINICAL INDICATION: Dense breast
TECHNIQUE: Digital bilateral mammograms and 3-D Tomosynthesis were obtained. 
These were interpreted both primarily and with the aid of computer-aided 
detection system. Complete bilateral breast sonography was performed as well 
including both axilla. 
BREAST DENSITY: (Level C) The breasts are heterogeneously dense, which may 
obscure small masses.

[L MLO]
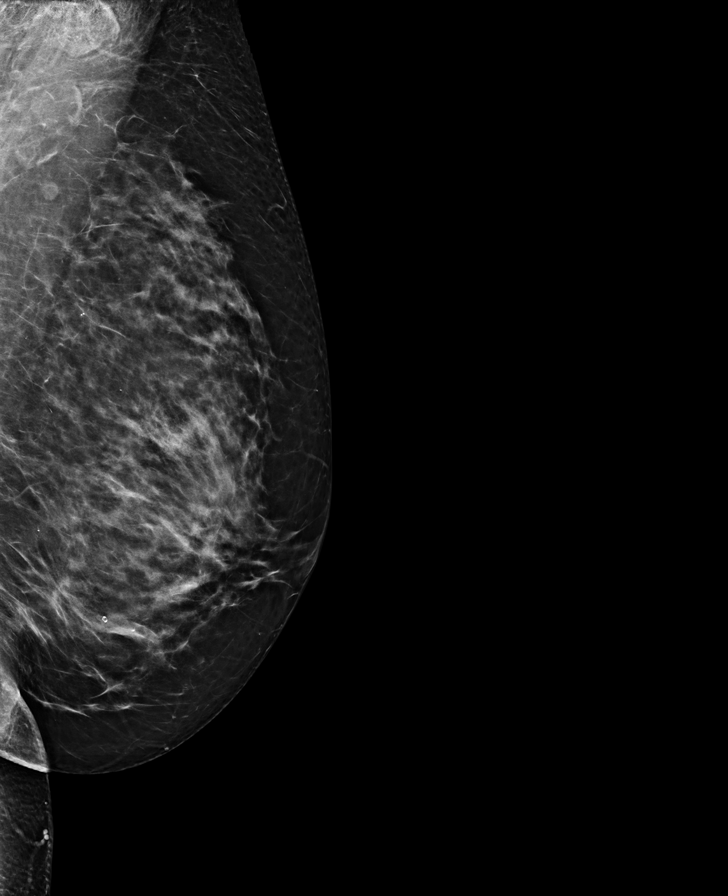

[R MLO]
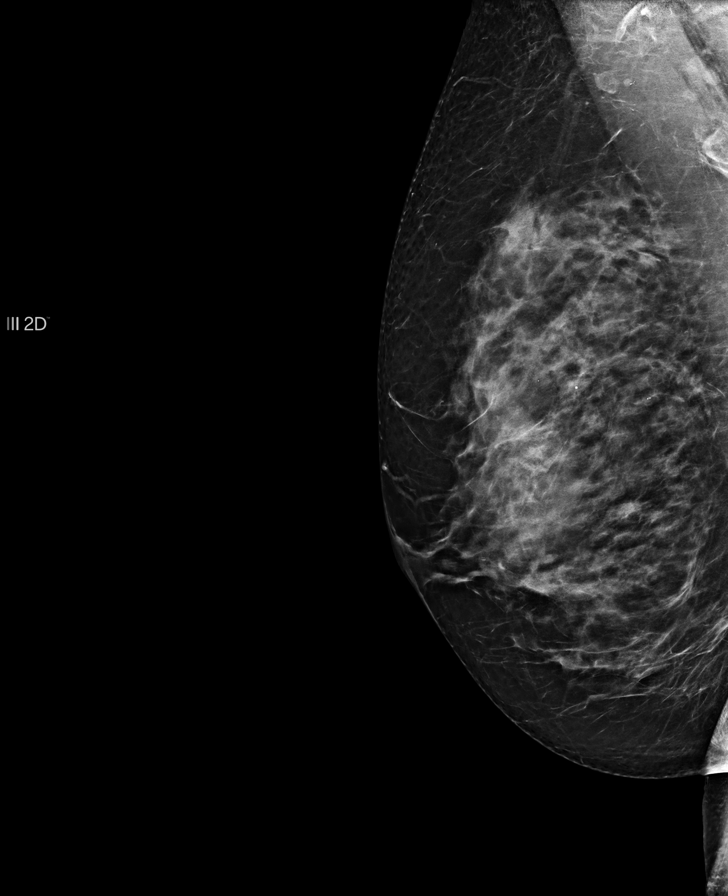

[L CC]
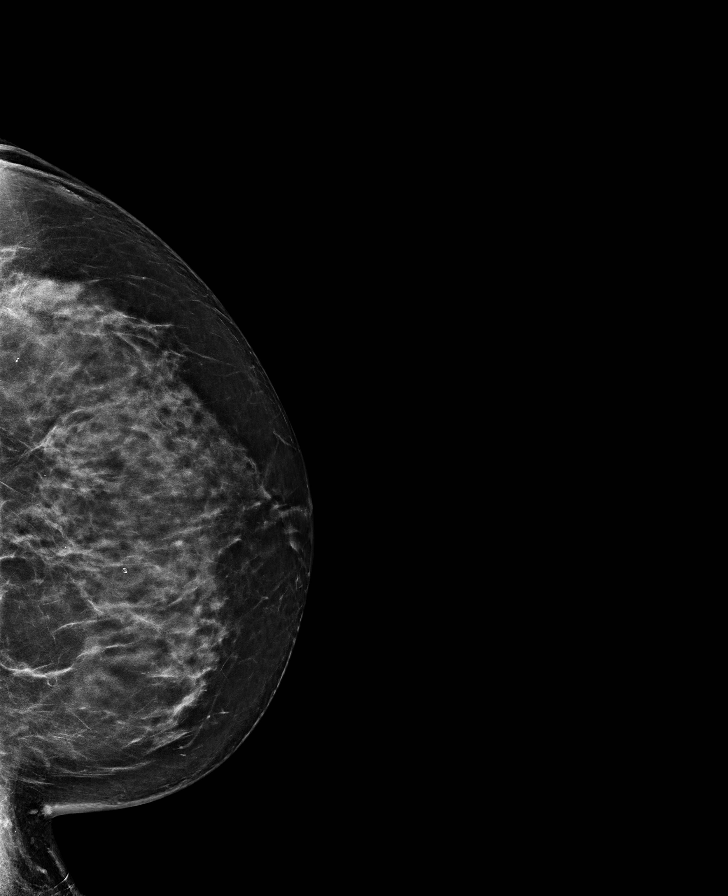

[R CC]
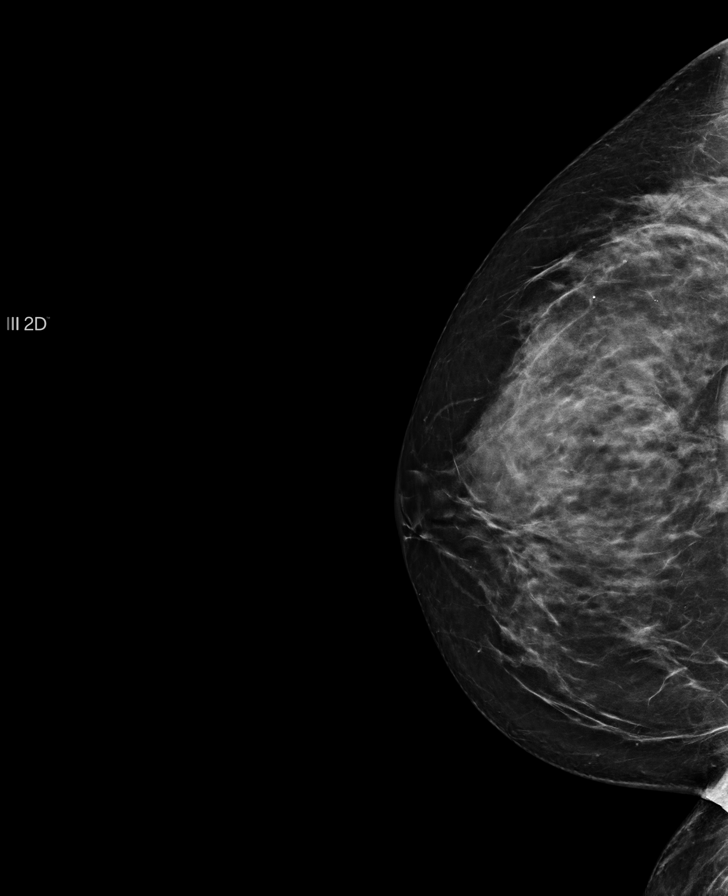

[R MLO tomo · tomo slice 42/83.0]
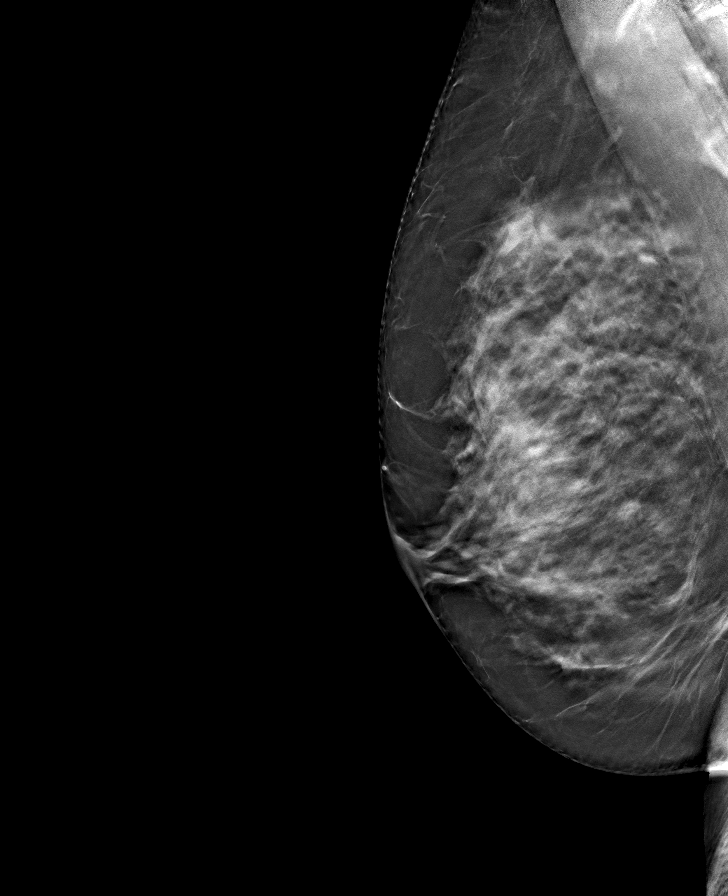

[L CC tomo · tomo slice 46/91.0]
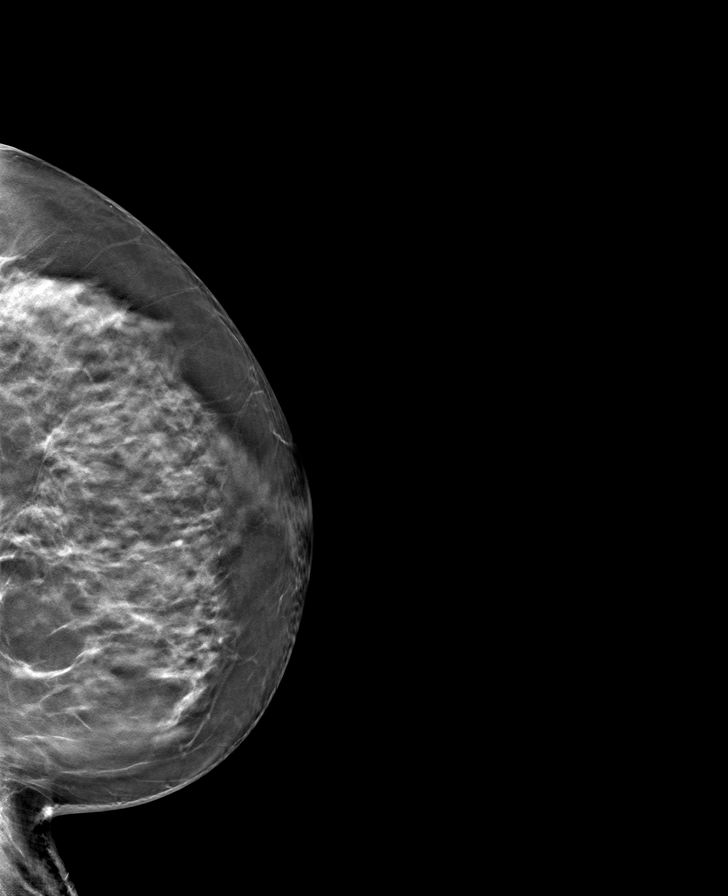

[R CC tomo · tomo slice 45/90.0]
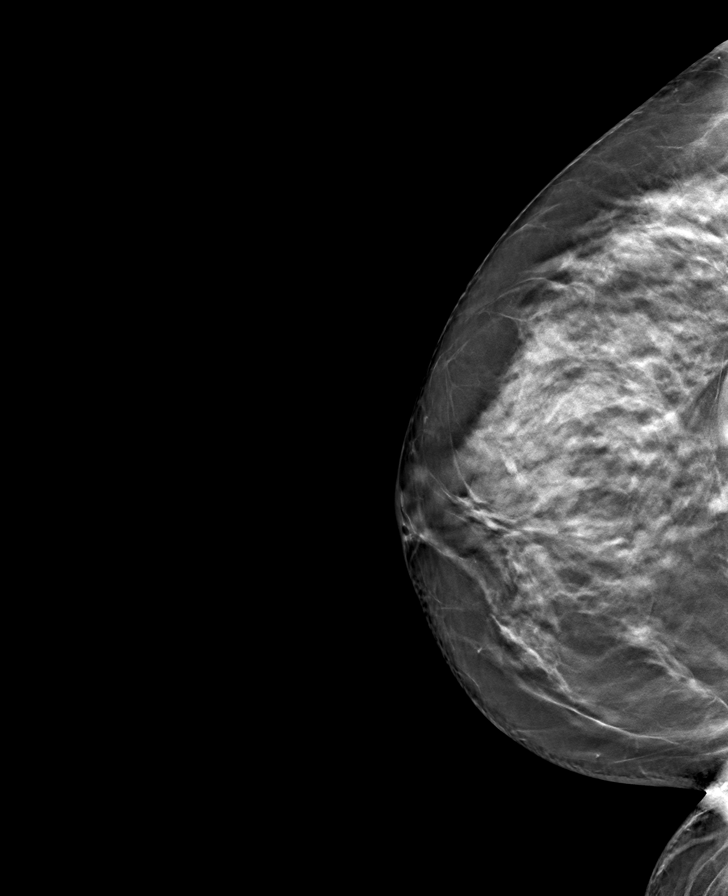

[L MLO tomo · tomo slice 43/84.0]
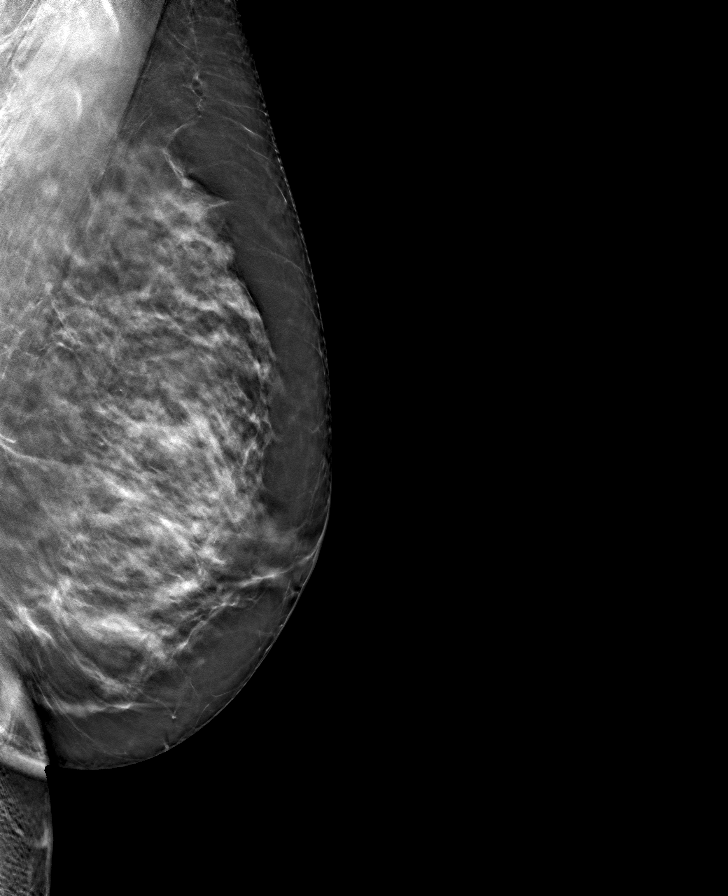

[8 of 24 positions shown; findings below may reference images not displayed]

FINDINGS: Stable mammographic appearance. Few scattered benign calcifications 
without new or suspicious mass seen. 
Bilateral breast sonography does not reveal suspicious mass. No solid or cystic 
lesions. Benign-appearing lymph nodes in the axilla.
IMPRESSION: Benign findings. 
(BI-RADS 2) Benign findings. Routine mammographic follow-up is recommended.

## 2022-12-28 IMAGING — MR MRI LEFT HIP WITHOUT CONTRAST
4 of 6 series · 18 of 40 positions shown · IV contrast (gadolinium)
Comparison: None.

________________________________________________________________________________________________ 
MRI RIGHT HIP WITHOUT CONTRAST, MRI LEFT HIP WITHOUT CONTRAST, 12/28/2022 [DATE]: 
CLINICAL INDICATION: Bilateral osteoarthritis of hips
TECHNIQUE: Multiplanar, multiecho position MR images of the pelvis and hips were 
performed without intravenous gadolinium enhancement. Small field-of-view 
imaging was performed of the hips.

[Series 201: T1 · axial · 5.0mm · 0.62mm/px · z∈[-120,+94]mm · 3 of 44 slices shown]
[im 5/44]
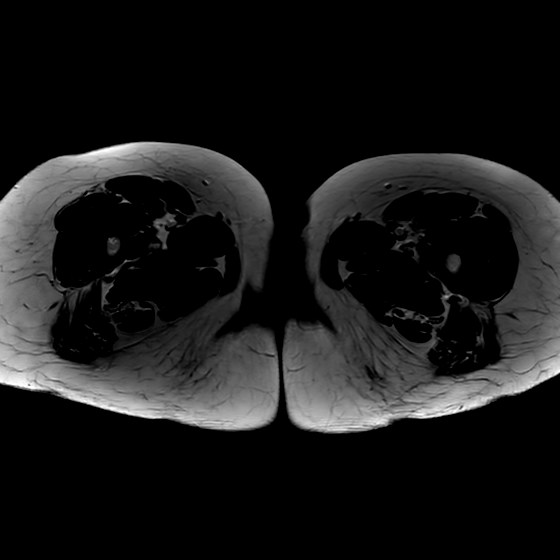
[im 24/44]
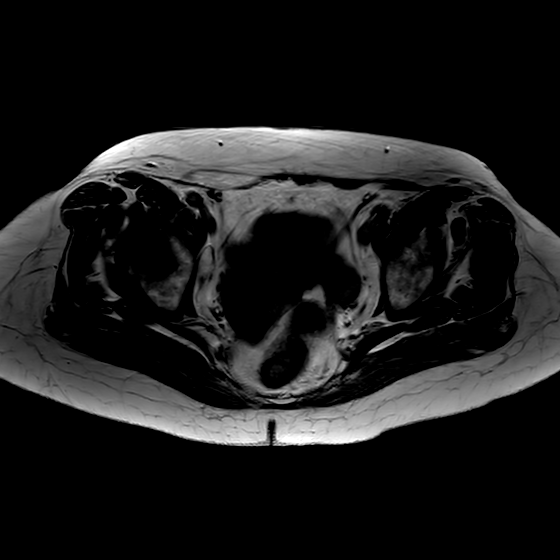
[im 39/44]
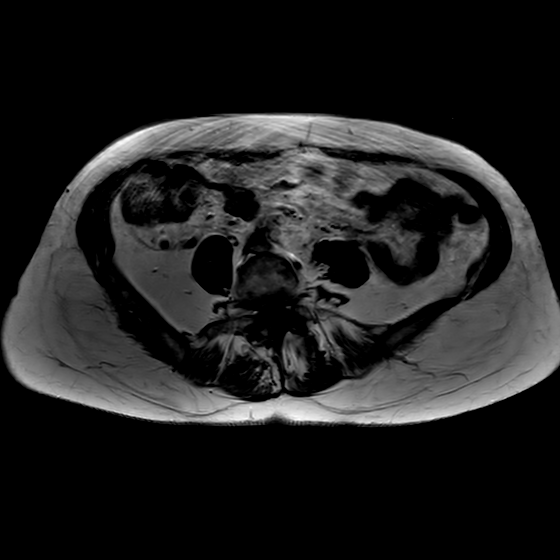

[Series 701: PD fat-sat · axial · 4.0mm · 0.35mm/px · z∈[-110,+32]mm · 7 of 32 slices shown (1 of 2)]
[im 1/32]
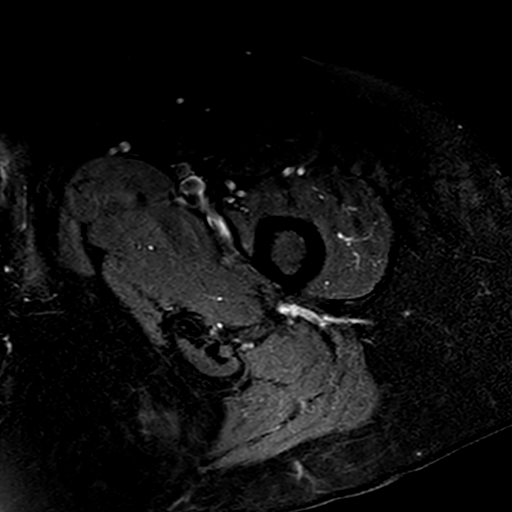
[im 6/32]
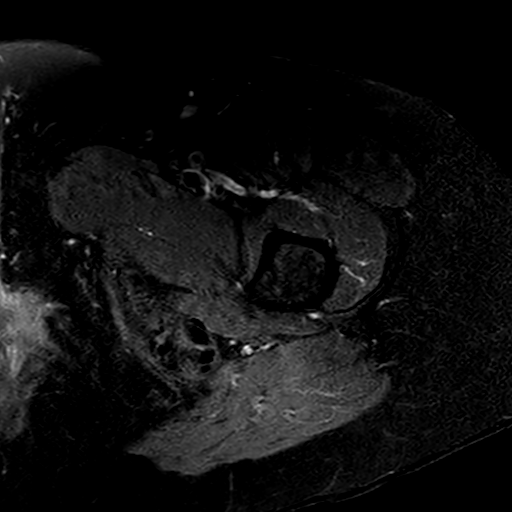
[im 11/32]
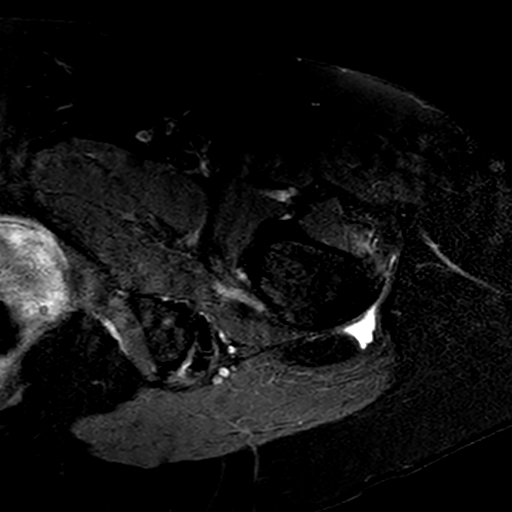
[im 16/32]
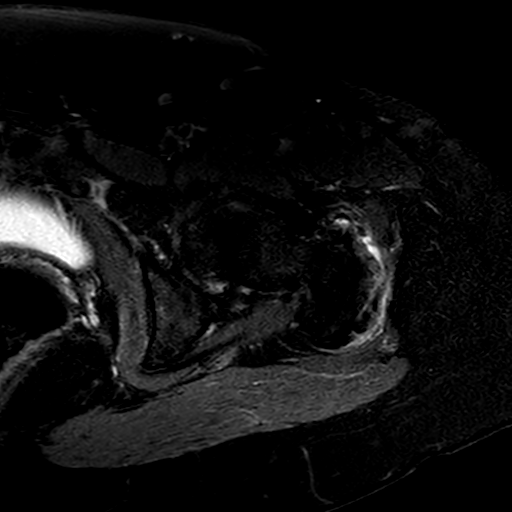
[im 21/32]
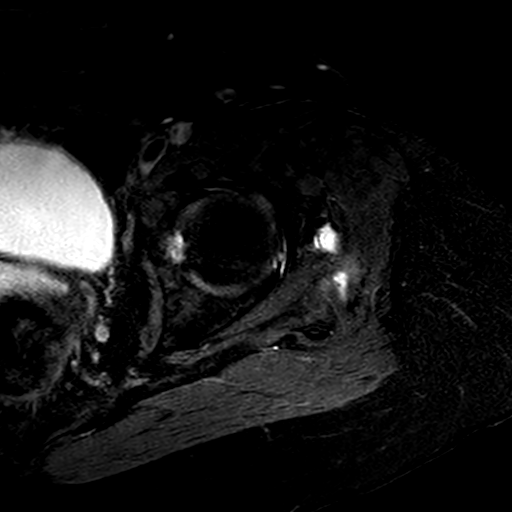
[im 26/32]
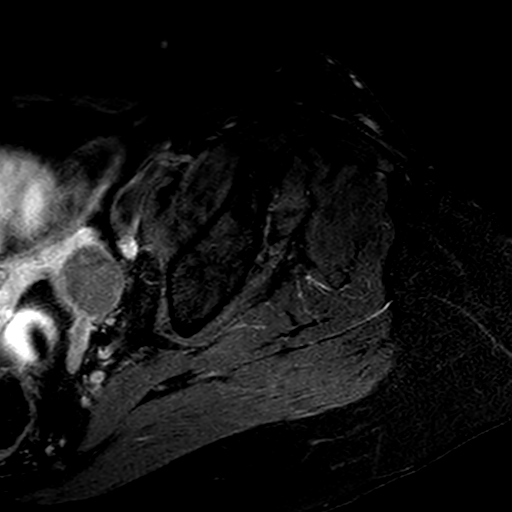
[im 32/32]
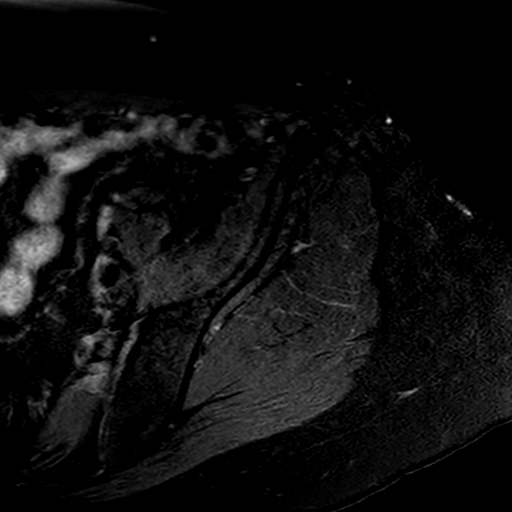

[Series 801: PD · sagittal · 3.5mm · 0.43mm/px · 3 of 36 slices shown]
[im 6/36]
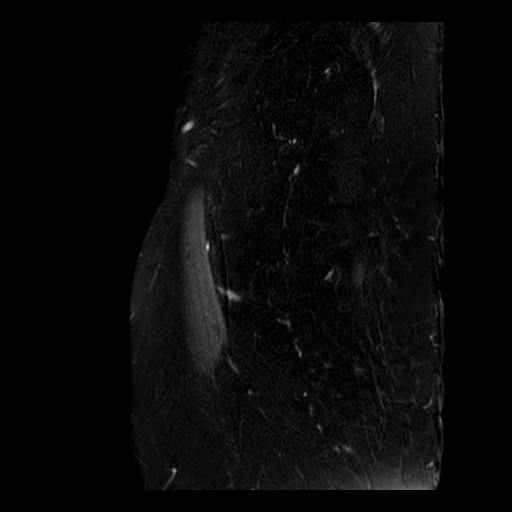
[im 21/36]
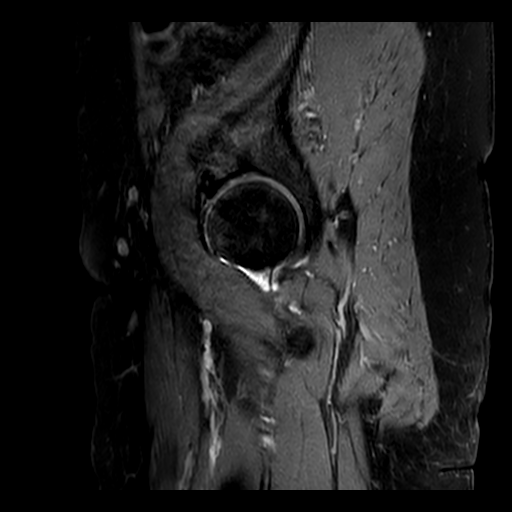
[im 31/36]
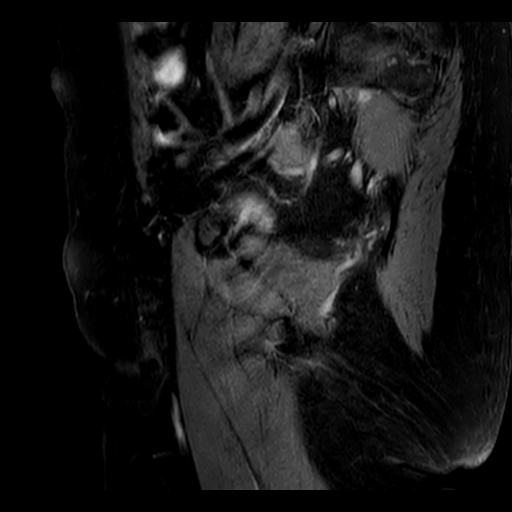

[Series 901: PD fat-sat · coronal · 3.2mm · 0.43mm/px · 5 of 20 slices shown (2 of 2)]
[im 1/20]
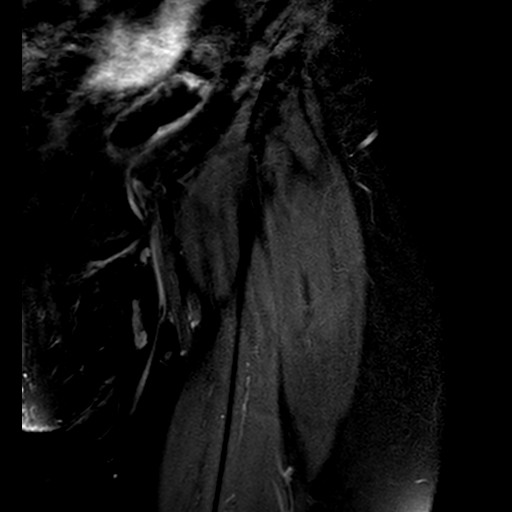
[im 5/20]
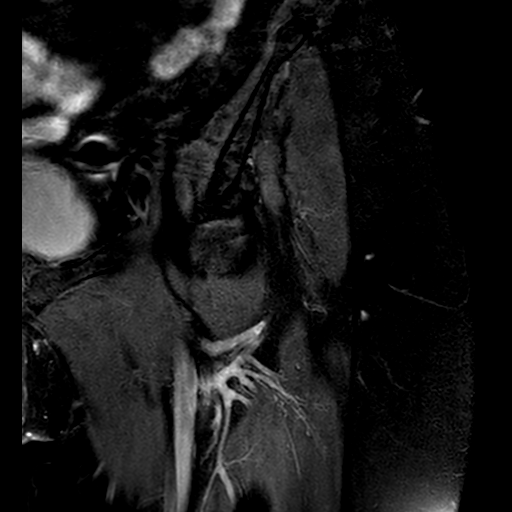
[im 10/20]
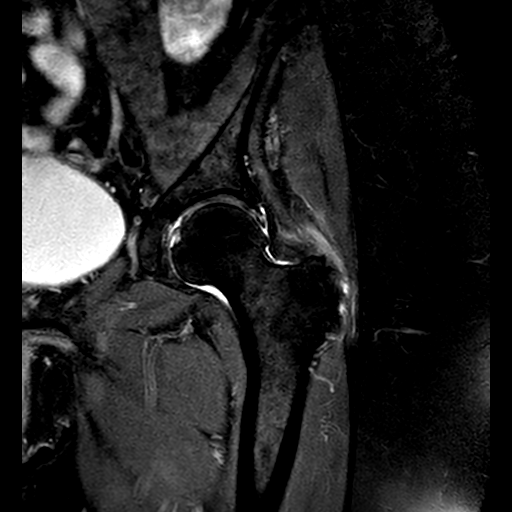
[im 15/20]
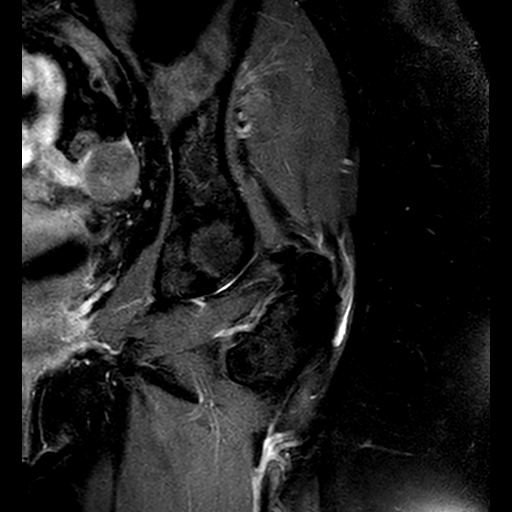
[im 20/20]
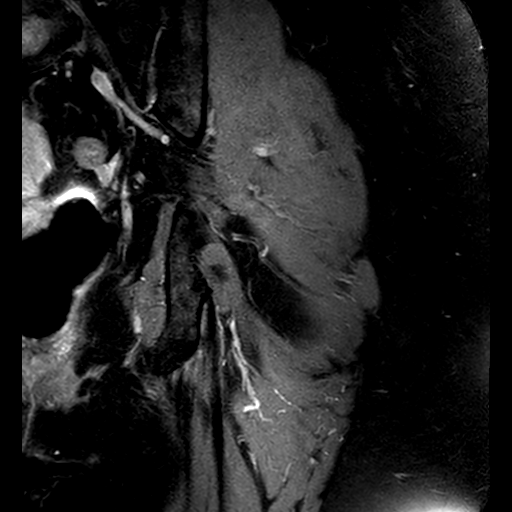

[18 of 40 positions shown; findings below may reference images not displayed]

FINDINGS: HIPS: Mild degenerative change of the hips with partial thickness 
chondromalacia, tiny osteophytes, degenerative anterior/superior labral tears 
and 1.5 cm right superior paralabral cyst. Small right hip joint effusion. Both 
femoral heads maintain a spherical configuration without evidence of avascular 
necrosis or subarticular collapse. No abnormal morphology of the proximal femurs 
or acetabulum to predispose to impingement. 
PELVIC BONES: Normal marrow signal intensity. No fracture, contusion or marrow 
replacing lesion.  
SI JOINTS/PUBIC SYMPHYSIS: SI joints are preserved.  
SPINE: Transitional lumbosacral segment. Mild degenerative change of the spine. 
SOFT TISSUES: Minimal right distal gluteus medius tendinosis. Partial thickness 
tears and tendinosis of the left gluteus medius/minimus tendons with small 
amount of peritendinous/peritrochanteric fluid. The origins of the hamstrings 
are intact. The rectus abdominis-adductor aponeurotic complexes are intact. No 
mass, free fluid or adenopathy. 1.5 cm fibroid in the posterior body. The bowel 
and bladder are unremarkable.
IMPRESSION: 1.  Mild degenerative change of the hips, degenerative anterior/superior labral 
tears, 1.5 cm right superior paralabral cyst and small right hip joint effusion. 

2.  Partial thickness tears and tendinosis of the left gluteus medius/minimus 
tendons with small amount of peritendinous/peritrochanteric fluid. Minimal right 
distal gluteus medius tendinosis.  
3.  1.5 cm fibroid in the posterior body.  
4.  Mild degenerative change of the spine.

## 2022-12-28 IMAGING — MR MRI RIGHT HIP WITHOUT CONTRAST
4 of 6 series · 18 of 40 positions shown · IV contrast (gadolinium)
Comparison: None.

________________________________________________________________________________________________ 
MRI RIGHT HIP WITHOUT CONTRAST, MRI LEFT HIP WITHOUT CONTRAST, 12/28/2022 [DATE]: 
CLINICAL INDICATION: Bilateral osteoarthritis of hips
TECHNIQUE: Multiplanar, multiecho position MR images of the pelvis and hips were 
performed without intravenous gadolinium enhancement. Small field-of-view 
imaging was performed of the hips.

[Series 201: T1 · axial · 5.0mm · 0.62mm/px · z∈[-120,+94]mm · 3 of 44 slices shown]
[im 5/44]
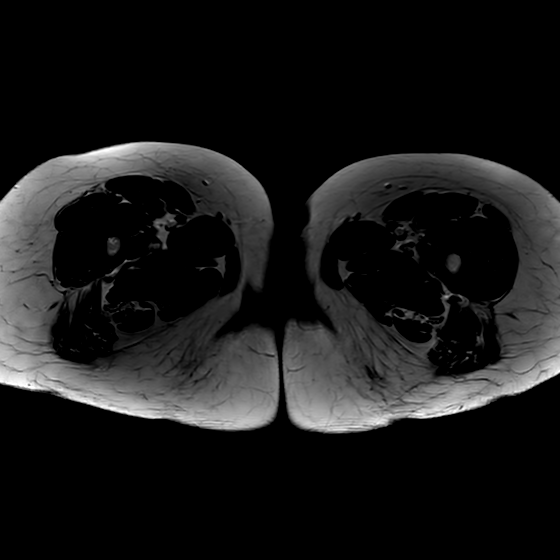
[im 24/44]
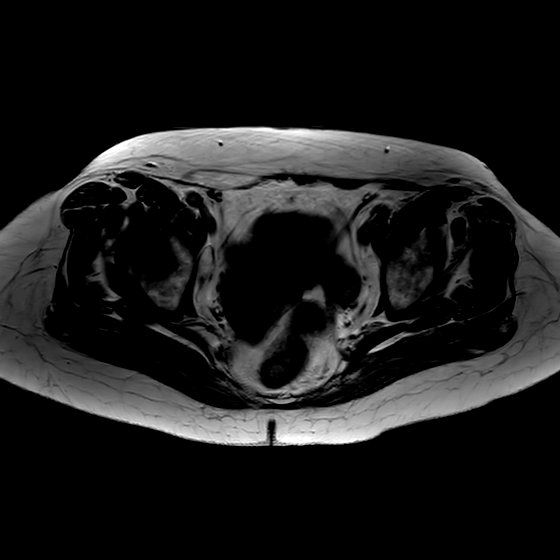
[im 39/44]
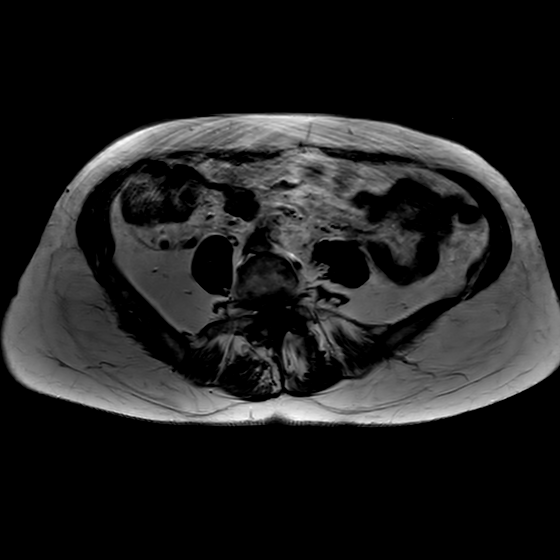

[Series 401: PD fat-sat · axial · 4.0mm · 0.35mm/px · z∈[-99,+44]mm · 7 of 32 slices shown (1 of 2)]
[im 1/32]
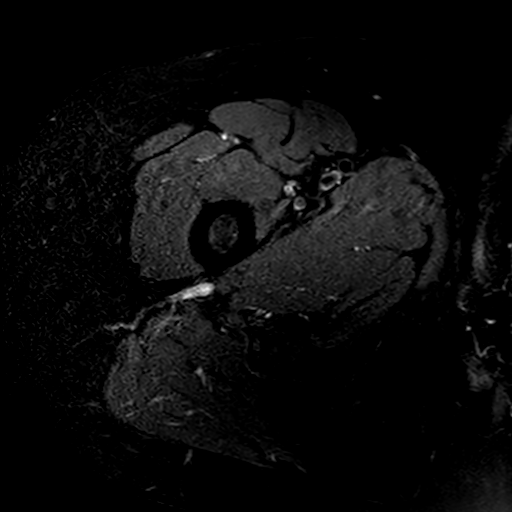
[im 6/32]
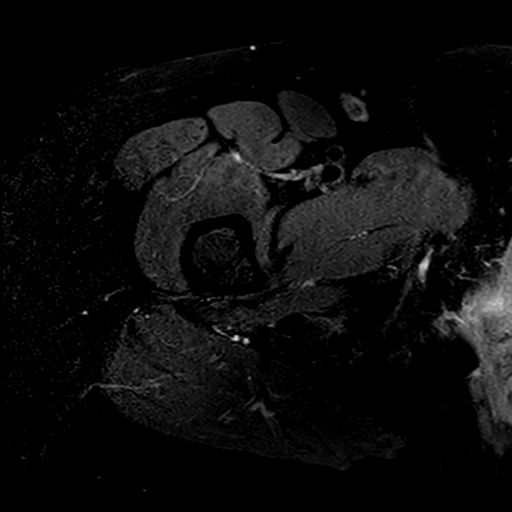
[im 11/32]
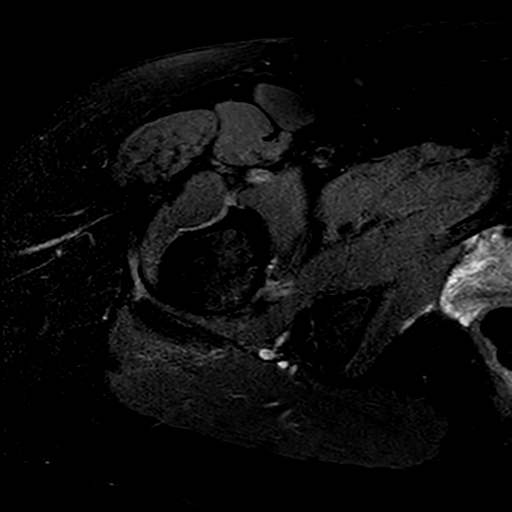
[im 16/32]
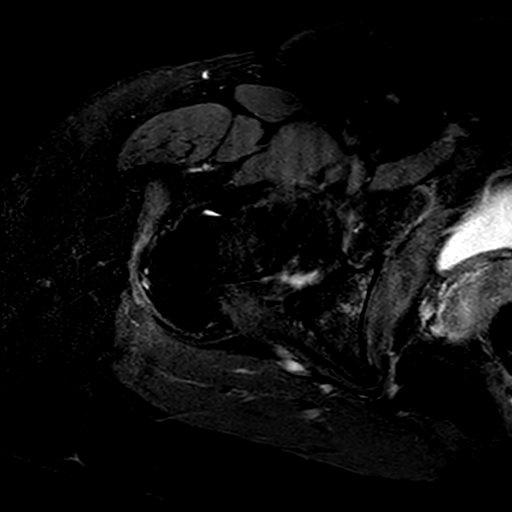
[im 21/32]
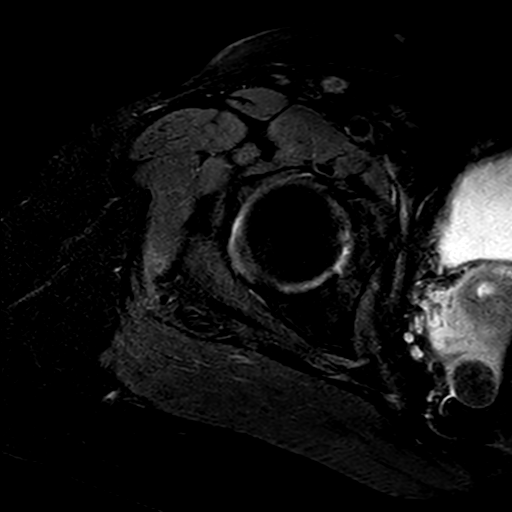
[im 26/32]
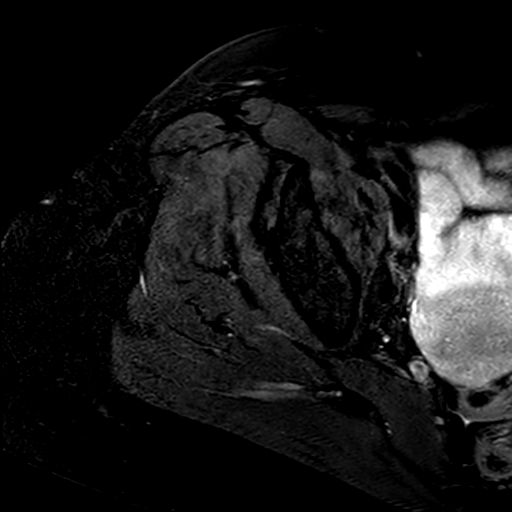
[im 32/32]
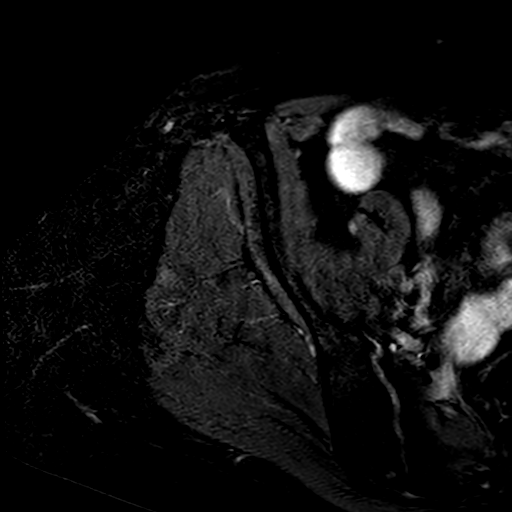

[Series 501: PD · sagittal · 3.5mm · 0.43mm/px · 3 of 36 slices shown]
[im 6/36]
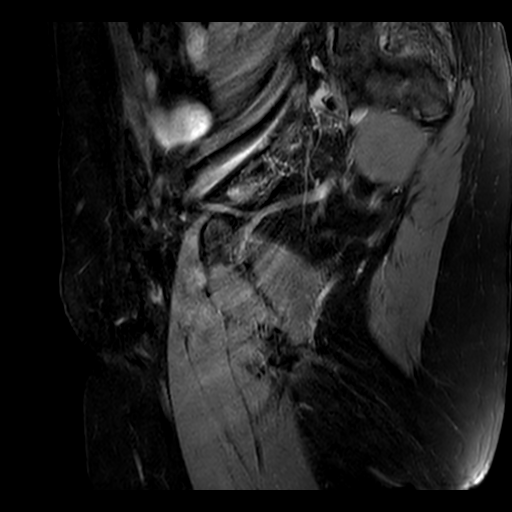
[im 21/36]
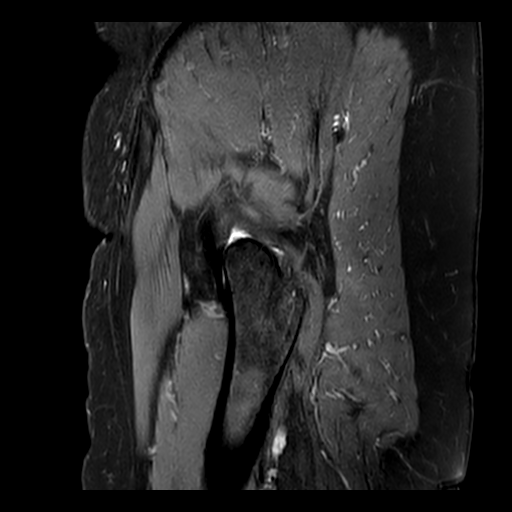
[im 31/36]
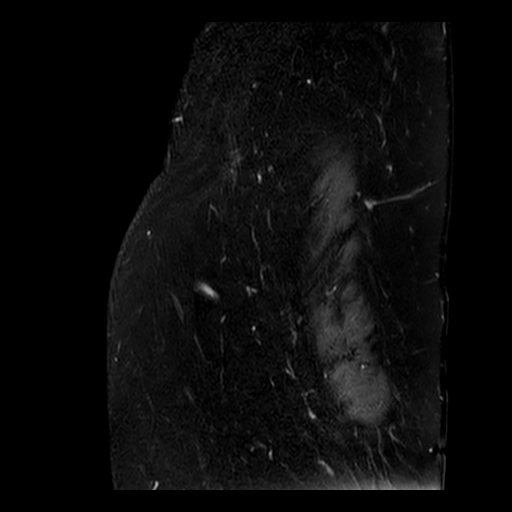

[Series 601: PD fat-sat · coronal · 3.2mm · 0.43mm/px · 5 of 20 slices shown (2 of 2)]
[im 1/20]
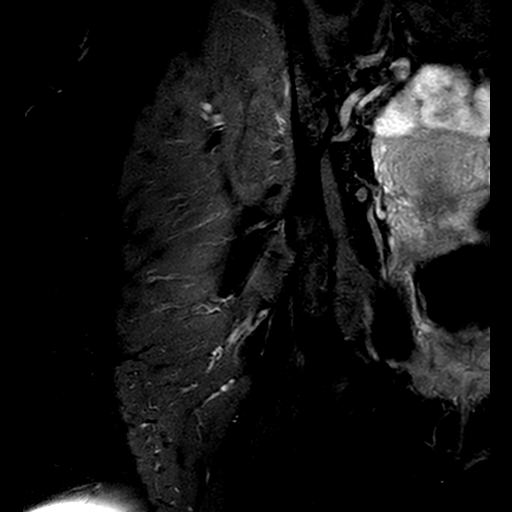
[im 5/20]
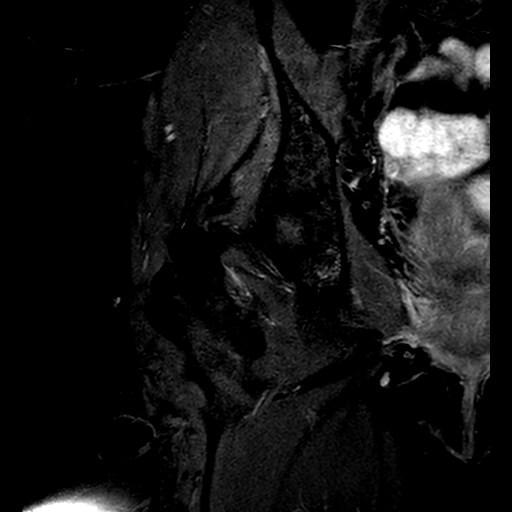
[im 10/20]
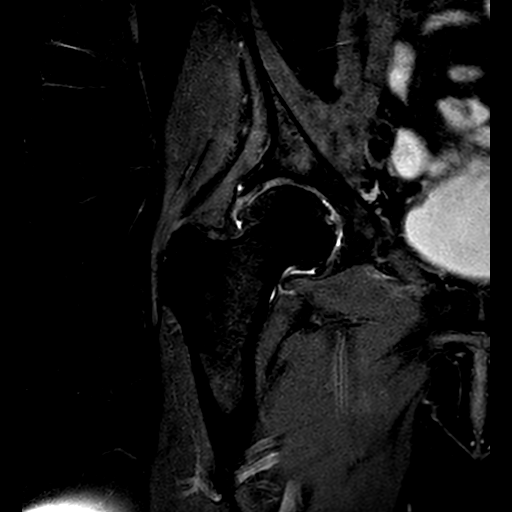
[im 15/20]
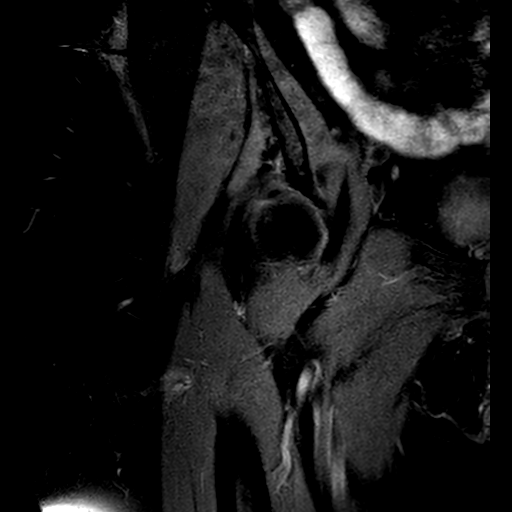
[im 20/20]
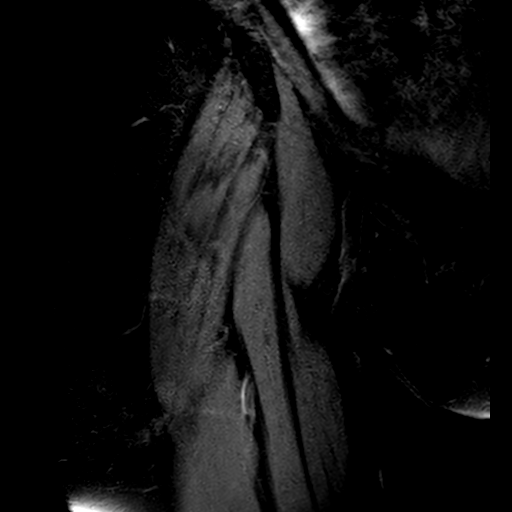

[18 of 40 positions shown; findings below may reference images not displayed]

FINDINGS: HIPS: Mild degenerative change of the hips with partial thickness 
chondromalacia, tiny osteophytes, degenerative anterior/superior labral tears 
and 1.5 cm right superior paralabral cyst. Small right hip joint effusion. Both 
femoral heads maintain a spherical configuration without evidence of avascular 
necrosis or subarticular collapse. No abnormal morphology of the proximal femurs 
or acetabulum to predispose to impingement. 
PELVIC BONES: Normal marrow signal intensity. No fracture, contusion or marrow 
replacing lesion.  
SI JOINTS/PUBIC SYMPHYSIS: SI joints are preserved.  
SPINE: Transitional lumbosacral segment. Mild degenerative change of the spine. 
SOFT TISSUES: Minimal right distal gluteus medius tendinosis. Partial thickness 
tears and tendinosis of the left gluteus medius/minimus tendons with small 
amount of peritendinous/peritrochanteric fluid. The origins of the hamstrings 
are intact. The rectus abdominis-adductor aponeurotic complexes are intact. No 
mass, free fluid or adenopathy. 1.5 cm fibroid in the posterior body. The bowel 
and bladder are unremarkable.
IMPRESSION: 1.  Mild degenerative change of the hips, degenerative anterior/superior labral 
tears, 1.5 cm right superior paralabral cyst and small right hip joint effusion. 

2.  Partial thickness tears and tendinosis of the left gluteus medius/minimus 
tendons with small amount of peritendinous/peritrochanteric fluid. Minimal right 
distal gluteus medius tendinosis.  
3.  1.5 cm fibroid in the posterior body.  
4.  Mild degenerative change of the spine.
